# Patient Record
Sex: Male | Born: 1965 | Race: Black or African American | Hispanic: No | Marital: Married | Smoking: Never smoker
Health system: Southern US, Community
[De-identification: ages and names within clinical notes are randomized; demographics above are authoritative.]

## PROBLEM LIST (undated history)

## (undated) DIAGNOSIS — E079 Disorder of thyroid, unspecified: Secondary | ICD-10-CM

## (undated) DIAGNOSIS — I471 Supraventricular tachycardia, unspecified: Secondary | ICD-10-CM

## (undated) DIAGNOSIS — M109 Gout, unspecified: Secondary | ICD-10-CM

## (undated) DIAGNOSIS — E7800 Pure hypercholesterolemia, unspecified: Secondary | ICD-10-CM

## (undated) DIAGNOSIS — E119 Type 2 diabetes mellitus without complications: Secondary | ICD-10-CM

## (undated) DIAGNOSIS — I10 Essential (primary) hypertension: Secondary | ICD-10-CM

## (undated) HISTORY — DX: Morbid (severe) obesity due to excess calories: E66.01

## (undated) HISTORY — DX: Supraventricular tachycardia: I47.1

## (undated) HISTORY — DX: Supraventricular tachycardia, unspecified: I47.10

## (undated) HISTORY — PX: ACHILLES TENDON REPAIR: SUR1153

---

## 2016-10-04 ENCOUNTER — Emergency Department (HOSPITAL_COMMUNITY): Payer: Managed Care, Other (non HMO)

## 2016-10-04 ENCOUNTER — Observation Stay (HOSPITAL_COMMUNITY)
Admission: EM | Admit: 2016-10-04 | Discharge: 2016-10-05 | Disposition: A | Discharge status: Home / Self Care | Payer: Managed Care, Other (non HMO) | Attending: Interventional Cardiology | Admitting: Interventional Cardiology

## 2016-10-04 ENCOUNTER — Encounter (HOSPITAL_COMMUNITY): Payer: Self-pay | Admitting: Emergency Medicine

## 2016-10-04 DIAGNOSIS — R7989 Other specified abnormal findings of blood chemistry: Secondary | ICD-10-CM | POA: Diagnosis not present

## 2016-10-04 DIAGNOSIS — N183 Chronic kidney disease, stage 3 unspecified: Secondary | ICD-10-CM

## 2016-10-04 DIAGNOSIS — I471 Supraventricular tachycardia: Secondary | ICD-10-CM | POA: Diagnosis not present

## 2016-10-04 DIAGNOSIS — Z7982 Long term (current) use of aspirin: Secondary | ICD-10-CM | POA: Insufficient documentation

## 2016-10-04 DIAGNOSIS — R0683 Snoring: Secondary | ICD-10-CM

## 2016-10-04 DIAGNOSIS — R748 Abnormal levels of other serum enzymes: Secondary | ICD-10-CM

## 2016-10-04 DIAGNOSIS — I2510 Atherosclerotic heart disease of native coronary artery without angina pectoris: Secondary | ICD-10-CM | POA: Insufficient documentation

## 2016-10-04 DIAGNOSIS — I129 Hypertensive chronic kidney disease with stage 1 through stage 4 chronic kidney disease, or unspecified chronic kidney disease: Secondary | ICD-10-CM | POA: Diagnosis not present

## 2016-10-04 DIAGNOSIS — E119 Type 2 diabetes mellitus without complications: Secondary | ICD-10-CM | POA: Diagnosis not present

## 2016-10-04 DIAGNOSIS — Z79899 Other long term (current) drug therapy: Secondary | ICD-10-CM | POA: Diagnosis not present

## 2016-10-04 DIAGNOSIS — Z7984 Long term (current) use of oral hypoglycemic drugs: Secondary | ICD-10-CM | POA: Diagnosis not present

## 2016-10-04 DIAGNOSIS — M109 Gout, unspecified: Secondary | ICD-10-CM | POA: Insufficient documentation

## 2016-10-04 DIAGNOSIS — E1121 Type 2 diabetes mellitus with diabetic nephropathy: Secondary | ICD-10-CM

## 2016-10-04 DIAGNOSIS — R000 Tachycardia, unspecified: Secondary | ICD-10-CM | POA: Diagnosis present

## 2016-10-04 DIAGNOSIS — E785 Hyperlipidemia, unspecified: Secondary | ICD-10-CM | POA: Insufficient documentation

## 2016-10-04 DIAGNOSIS — R778 Other specified abnormalities of plasma proteins: Secondary | ICD-10-CM

## 2016-10-04 DIAGNOSIS — I10 Essential (primary) hypertension: Secondary | ICD-10-CM | POA: Diagnosis not present

## 2016-10-04 HISTORY — DX: Gout, unspecified: M10.9

## 2016-10-04 HISTORY — DX: Pure hypercholesterolemia, unspecified: E78.00

## 2016-10-04 HISTORY — DX: Disorder of thyroid, unspecified: E07.9

## 2016-10-04 HISTORY — DX: Type 2 diabetes mellitus without complications: E11.9

## 2016-10-04 HISTORY — DX: Essential (primary) hypertension: I10

## 2016-10-04 LAB — CBC WITH DIFFERENTIAL/PLATELET
BASOS ABS: 0 10*3/uL (ref 0.0–0.1)
BASOS PCT: 0 %
Eosinophils Absolute: 0.1 10*3/uL (ref 0.0–0.7)
Eosinophils Relative: 1 %
HEMATOCRIT: 47.7 % (ref 39.0–52.0)
Hemoglobin: 15.2 g/dL (ref 13.0–17.0)
Lymphocytes Relative: 29 %
Lymphs Abs: 2.9 10*3/uL (ref 0.7–4.0)
MCH: 25.2 pg — ABNORMAL LOW (ref 26.0–34.0)
MCHC: 31.9 g/dL (ref 30.0–36.0)
MCV: 79.1 fL (ref 78.0–100.0)
MONO ABS: 0.8 10*3/uL (ref 0.1–1.0)
Monocytes Relative: 8 %
NEUTROS ABS: 6 10*3/uL (ref 1.7–7.7)
Neutrophils Relative %: 62 %
PLATELETS: 224 10*3/uL (ref 150–400)
RBC: 6.03 MIL/uL — ABNORMAL HIGH (ref 4.22–5.81)
RDW: 16 % — AB (ref 11.5–15.5)
WBC: 9.8 10*3/uL (ref 4.0–10.5)

## 2016-10-04 LAB — COMPREHENSIVE METABOLIC PANEL
ALBUMIN: 4.1 g/dL (ref 3.5–5.0)
ALT: 72 U/L — AB (ref 17–63)
AST: 39 U/L (ref 15–41)
Alkaline Phosphatase: 83 U/L (ref 38–126)
Anion gap: 11 (ref 5–15)
BILIRUBIN TOTAL: 1.3 mg/dL — AB (ref 0.3–1.2)
BUN: 15 mg/dL (ref 6–20)
CO2: 27 mmol/L (ref 22–32)
Calcium: 10.1 mg/dL (ref 8.9–10.3)
Chloride: 104 mmol/L (ref 101–111)
Creatinine, Ser: 1.59 mg/dL — ABNORMAL HIGH (ref 0.61–1.24)
GFR calc Af Amer: 56 mL/min — ABNORMAL LOW (ref >60)
GFR calc non Af Amer: 49 mL/min — ABNORMAL LOW (ref >60)
Glucose, Bld: 146 mg/dL — ABNORMAL HIGH (ref 65–99)
POTASSIUM: 4 mmol/L (ref 3.5–5.1)
Sodium: 142 mmol/L (ref 135–145)
TOTAL PROTEIN: 7.6 g/dL (ref 6.5–8.1)

## 2016-10-04 LAB — I-STAT TROPONIN, ED
TROPONIN I, POC: 0.01 ng/mL (ref 0.00–0.08)
TROPONIN I, POC: 0.14 ng/mL — AB (ref 0.00–0.08)
TROPONIN I, POC: 0.32 ng/mL — AB (ref 0.00–0.08)

## 2016-10-04 LAB — BRAIN NATRIURETIC PEPTIDE: B NATRIURETIC PEPTIDE 5: 44.9 pg/mL (ref 0.0–100.0)

## 2016-10-04 LAB — GLUCOSE, CAPILLARY
GLUCOSE-CAPILLARY: 213 mg/dL — AB (ref 65–99)
GLUCOSE-CAPILLARY: 153 mg/dL — AB (ref 65–99)

## 2016-10-04 LAB — TROPONIN I: Troponin I: 0.69 ng/mL (ref <0.03)

## 2016-10-04 LAB — BASIC METABOLIC PANEL
ANION GAP: 11 (ref 5–15)
BUN: 14 mg/dL (ref 6–20)
CO2: 25 mmol/L (ref 22–32)
Calcium: 9.7 mg/dL (ref 8.9–10.3)
Chloride: 102 mmol/L (ref 101–111)
Creatinine, Ser: 2.03 mg/dL — ABNORMAL HIGH (ref 0.61–1.24)
GFR, EST AFRICAN AMERICAN: 42 mL/min — AB (ref >60)
GFR, EST NON AFRICAN AMERICAN: 36 mL/min — AB (ref >60)
Glucose, Bld: 214 mg/dL — ABNORMAL HIGH (ref 65–99)
Potassium: 3.4 mmol/L — ABNORMAL LOW (ref 3.5–5.1)
SODIUM: 138 mmol/L (ref 135–145)

## 2016-10-04 LAB — T4, FREE: FREE T4: 0.85 ng/dL (ref 0.61–1.12)

## 2016-10-04 LAB — TSH
TSH: 8.242 u[IU]/mL — AB (ref 0.350–4.500)
TSH: 2.097 u[IU]/mL (ref 0.350–4.500)

## 2016-10-04 LAB — CBC
HCT: 47.2 % (ref 39.0–52.0)
Hemoglobin: 15.1 g/dL (ref 13.0–17.0)
MCH: 25.3 pg — ABNORMAL LOW (ref 26.0–34.0)
MCHC: 32 g/dL (ref 30.0–36.0)
MCV: 79.2 fL (ref 78.0–100.0)
PLATELETS: 243 10*3/uL (ref 150–400)
RBC: 5.96 MIL/uL — AB (ref 4.22–5.81)
RDW: 15.9 % — ABNORMAL HIGH (ref 11.5–15.5)
WBC: 10.5 10*3/uL (ref 4.0–10.5)

## 2016-10-04 LAB — HEMOGLOBIN A1C
HEMOGLOBIN A1C: 7.8 % — AB (ref 4.8–5.6)
Mean Plasma Glucose: 177.16 mg/dL

## 2016-10-04 LAB — MAGNESIUM: Magnesium: 2.2 mg/dL (ref 1.7–2.4)

## 2016-10-04 MED ORDER — INSULIN ASPART 100 UNIT/ML ~~LOC~~ SOLN
0.0000 [IU] | Freq: Three times a day (TID) | SUBCUTANEOUS | Status: DC
  Administered 2016-10-04: 3 [IU] via SUBCUTANEOUS
  Administered 2016-10-05 (×2): 1 [IU] via SUBCUTANEOUS

## 2016-10-04 MED ORDER — INSULIN ASPART 100 UNIT/ML ~~LOC~~ SOLN: 0.0000 [IU] | Freq: Three times a day (TID) | SUBCUTANEOUS | Status: DC

## 2016-10-04 MED ORDER — SODIUM CHLORIDE 0.9% FLUSH
3.0000 mL | Freq: Two times a day (BID) | INTRAVENOUS | Status: DC
  Administered 2016-10-04 – 2016-10-05 (×2): 3 mL via INTRAVENOUS

## 2016-10-04 MED ORDER — VITAMIN D (ERGOCALCIFEROL) 1.25 MG (50000 UNIT) PO CAPS: 50000.0000 [IU] | ORAL_CAPSULE | ORAL | Status: DC

## 2016-10-04 MED ORDER — LOSARTAN POTASSIUM 50 MG PO TABS
50.0000 mg | ORAL_TABLET | Freq: Every day | ORAL | Status: DC
  Administered 2016-10-04 – 2016-10-05 (×2): 50 mg via ORAL
  Filled 2016-10-04 (×2): qty 1

## 2016-10-04 MED ORDER — POTASSIUM CHLORIDE CRYS ER 20 MEQ PO TBCR
40.0000 meq | EXTENDED_RELEASE_TABLET | Freq: Once | ORAL | Status: AC
  Administered 2016-10-04: 40 meq via ORAL
  Filled 2016-10-04: qty 2

## 2016-10-04 MED ORDER — MELOXICAM 7.5 MG PO TABS
15.0000 mg | ORAL_TABLET | Freq: Every day | ORAL | Status: DC
  Administered 2016-10-04 – 2016-10-05 (×2): 15 mg via ORAL
  Filled 2016-10-04: qty 1
  Filled 2016-10-04: qty 2

## 2016-10-04 MED ORDER — ASPIRIN 81 MG PO CHEW
324.0000 mg | CHEWABLE_TABLET | Freq: Once | ORAL | Status: AC
  Administered 2016-10-04: 324 mg via ORAL
  Filled 2016-10-04: qty 4

## 2016-10-04 MED ORDER — ONDANSETRON HCL 4 MG/2ML IJ SOLN: 4.0000 mg | Freq: Four times a day (QID) | INTRAMUSCULAR | Status: DC | PRN

## 2016-10-04 MED ORDER — ADENOSINE 6 MG/2ML IV SOLN
6.0000 mg | Freq: Once | INTRAVENOUS | Status: AC
  Administered 2016-10-04: 6 mg via INTRAVENOUS

## 2016-10-04 MED ORDER — METOPROLOL TARTRATE 50 MG PO TABS
50.0000 mg | ORAL_TABLET | Freq: Two times a day (BID) | ORAL | Status: DC
  Administered 2016-10-04 – 2016-10-05 (×2): 50 mg via ORAL
  Filled 2016-10-04 (×2): qty 1

## 2016-10-04 MED ORDER — ASPIRIN EC 81 MG PO TBEC: 81.0000 mg | DELAYED_RELEASE_TABLET | Freq: Every day | ORAL | Status: DC

## 2016-10-04 MED ORDER — ADENOSINE 6 MG/2ML IV SOLN
INTRAVENOUS | Status: AC
  Filled 2016-10-04: qty 6

## 2016-10-04 MED ORDER — SODIUM CHLORIDE 0.9 % IV SOLN: 250.0000 mL | INTRAVENOUS | Status: DC | PRN

## 2016-10-04 MED ORDER — NITROGLYCERIN 0.4 MG SL SUBL
0.4000 mg | SUBLINGUAL_TABLET | SUBLINGUAL | Status: DC | PRN
Start: 2016-10-04 — End: 2016-10-05

## 2016-10-04 MED ORDER — LEVOTHYROXINE SODIUM 100 MCG PO TABS
100.0000 ug | ORAL_TABLET | Freq: Every day | ORAL | Status: DC
  Administered 2016-10-05: 100 ug via ORAL
  Filled 2016-10-04: qty 1

## 2016-10-04 MED ORDER — ENOXAPARIN SODIUM 150 MG/ML ~~LOC~~ SOLN
160.0000 mg | Freq: Two times a day (BID) | SUBCUTANEOUS | Status: DC
  Administered 2016-10-04 – 2016-10-05 (×2): 160 mg via SUBCUTANEOUS
  Filled 2016-10-04 (×2): qty 2

## 2016-10-04 MED ORDER — LOSARTAN POTASSIUM-HCTZ 50-12.5 MG PO TABS: 1.0000 | ORAL_TABLET | Freq: Every day | ORAL | Status: DC

## 2016-10-04 MED ORDER — PHENYLEPHRINE 40 MCG/ML (10ML) SYRINGE FOR IV PUSH (FOR BLOOD PRESSURE SUPPORT)
PREFILLED_SYRINGE | INTRAVENOUS | Status: AC
  Filled 2016-10-04: qty 10

## 2016-10-04 MED ORDER — HYDROCHLOROTHIAZIDE 12.5 MG PO CAPS
12.5000 mg | ORAL_CAPSULE | Freq: Every day | ORAL | Status: DC
  Administered 2016-10-04 – 2016-10-05 (×2): 12.5 mg via ORAL
  Filled 2016-10-04 (×2): qty 1

## 2016-10-04 MED ORDER — ACETAMINOPHEN 325 MG PO TABS
650.0000 mg | ORAL_TABLET | ORAL | Status: DC | PRN
Start: 2016-10-04 — End: 2016-10-05

## 2016-10-04 MED ORDER — CANAGLIFLOZIN 100 MG PO TABS
100.0000 mg | ORAL_TABLET | Freq: Every day | ORAL | Status: DC
  Administered 2016-10-05: 100 mg via ORAL
  Filled 2016-10-04: qty 1

## 2016-10-04 MED ORDER — ASPIRIN 81 MG PO CHEW: 324.0000 mg | CHEWABLE_TABLET | ORAL | Status: DC

## 2016-10-04 MED ORDER — ASPIRIN EC 81 MG PO TBEC
81.0000 mg | DELAYED_RELEASE_TABLET | Freq: Every day | ORAL | Status: DC
  Administered 2016-10-04 – 2016-10-05 (×2): 81 mg via ORAL
  Filled 2016-10-04 (×2): qty 1

## 2016-10-04 MED ORDER — SODIUM CHLORIDE 0.9% FLUSH: 3.0000 mL | INTRAVENOUS | Status: DC | PRN

## 2016-10-04 MED ORDER — METFORMIN HCL 500 MG PO TABS
1000.0000 mg | ORAL_TABLET | Freq: Two times a day (BID) | ORAL | Status: DC
  Administered 2016-10-04: 1000 mg via ORAL
  Filled 2016-10-04 (×2): qty 2

## 2016-10-04 MED ORDER — ASPIRIN 300 MG RE SUPP: 300.0000 mg | RECTAL | Status: DC

## 2016-10-04 NOTE — ED Notes (Signed)
Pt is awaiting Cardiology consult. Per Bard Herbertyler PA schlossman MD spoke with him at 0700.

## 2016-10-04 NOTE — H&P (Signed)
Cardiology Admission History and Physical:   Patient ID: Walter Gonzales; MRN: [id number]; DOB: 1965-03-08   Admission date: 10/04/2016  Primary Care Provider: Shelbie Ammons, MD Primary Cardiologist: Thayer Ohm Primary Electrophysiologist:  None  Chief Complaint:  SVT and elevated cardiac markers.  Patient Profile:   Walter Gonzales is a 51 y.o. male with a history of Morbid obesity, gout, hypertension, diabetes mellitus, hyperlipidemia, and premature coronary atherosclerosis who presented with SVT and heart rates greater than 210 bpm. It was accompanied by chest pressure. Adenosine broke the arrhythmia and the patient immediately began feeling better.  History of Present Illness:   Walter Gonzales awakened from sleep and noticed diaphoresis, weakness, chest tightness, and cough. This feeling persisted. Over the past 2 years he had had wanted to previous similar episodes over the last several minutes and resolved with coughing or belching. He continued to feel "sick" and told his wife he needed to come for evaluation. In the emergency room he was found to have narrow complex tachycardia at 216 bpm that broke with IV adenosine. Subsequently cardiac markers are trending upward. He is asymptomatic. Chest discomfort resolved after conversion.  He is ordinarily quite active. He bowls frequently. He is able to walk stairs in his house without difficulty. He is a [employer]. No history of heart disease. Both his mother and father died of myocardial infarction. Grandmother died of congestive heart failure.   Past Medical History:  Diagnosis Date  . Diabetes mellitus without complication (HCC)   . Gout   . Hypercholesterolemia   . Hypertension   . Thyroid disease    Hypothyroidism    Past Surgical History:  Procedure Laterality Date  . ACHILLES TENDON REPAIR Right      Medications Prior to Admission: Prior to Admission medications   Medication Sig Start Date End Date Taking?  Authorizing Provider  aspirin EC 81 MG tablet Take 81 mg by mouth daily.   Yes [provider]  empagliflozin (JARDIANCE) 10 MG TABS tablet Take 10 mg by mouth daily.   Yes [provider]  levothyroxine (SYNTHROID, LEVOTHROID) 100 MCG tablet Take 100 mcg by mouth daily before breakfast.   Yes [provider]  losartan-hydrochlorothiazide (HYZAAR) 50-12.5 MG tablet Take 1 tablet by mouth daily.   Yes [provider]  meloxicam (MOBIC) 15 MG tablet Take 15 mg by mouth daily.   Yes [provider]  metFORMIN (GLUCOPHAGE) 500 MG tablet Take 1,000 mg by mouth 2 (two) times daily with a meal.   Yes [provider]  Vitamin D, Ergocalciferol, (DRISDOL) 50000 units CAPS capsule Take 50,000 Units by mouth 2 (two) times a week. Monday and Tuesday   Yes [provider]     Allergies:   No Known Allergies  Social History:   Social History   Social History  . Marital status: Married    Spouse name: N/A  . Number of children: N/A  . Years of education: N/A   Occupational History  . Not on file.   Social History Main Topics  . Smoking status: Never Smoker  . Smokeless tobacco: Never Used  . Alcohol use Yes     Comment: occ  . Drug use: No  . Sexual activity: Yes   Other Topics Concern  . Not on file   Social History Narrative  . No narrative on file    Family History:   The patient's family history includes Heart attack in his father and mother;  Heart failure in his maternal grandmother.    ROS:  Please see the history of present illness.  Currently having a gout attack. He snores at night. Doesn't sleep well. Prior history of ruptured Achilles tendon in the right lower extremity. Blood pressures are not well controlled. All other ROS reviewed and negative.     Physical Exam/Data:   Vitals:   10/04/16 0730 10/04/16 0830 10/04/16 0903 10/04/16 0930  BP: (!) 176/106 (!) 166/100 (!) 166/110 (!) 162/103  Pulse: 96 97 86 99   Resp: (!) 21 20 18 19   Temp:      TempSrc:      SpO2: 98% 98% 95% 97%  Weight:      Height:       No intake or output data in the 24 hours ending 10/04/16 1031 Filed Weights   10/04/16 0051  Weight: (!) 360 lb (163.3 kg)   Body mass index is 45 kg/m.  General:  Well nourished, well developed, in no acute distress. Morbidly obese. HEENT: normal Lymph: no adenopathy Neck: no JVD Endocrine:  No thryomegaly Vascular: No carotid bruits; FA pulses 2+ bilaterally without bruits  Cardiac:  normal S1, S2; RRR; no murmur . Lungs:  clear to auscultation bilaterally, no wheezing, rhonchi or rales  Abd: soft, nontender, no hepatomegaly  Ext: no edema Musculoskeletal:  No deformities, BUE and BLE strength normal and equal Skin: warm and dry  Neuro:  CNs 2-12 intact, no focal abnormalities noted Psych:  Normal affect    EKG:  The ECG that was done on 10/04/2016 at 12:46 AM was personally reviewed and demonstrates  revealed narrow complex tachycardia at 216 bpm with diffuse J-point depression. Postconversion EKG on 10/04/2016 at 1:20 AM revealed leftward axis, normal sinus rhythm, with minimal J-point depression. No acute ST segment changes were noted.  Relevant CV Studies: None  Laboratory Data:  Chemistry Recent Labs Lab 10/04/16 0050  NA 138  K 3.4*  CL 102  CO2 25  GLUCOSE 214*  BUN 14  CREATININE 2.03*  CALCIUM 9.7  GFRNONAA 36*  GFRAA 42*  ANIONGAP 11    No results for input(s): PROT, ALBUMIN, AST, ALT, ALKPHOS, BILITOT in the last 168 hours. Hematology Recent Labs Lab 10/04/16 0050  WBC 10.5  RBC 5.96*  HGB 15.1  HCT 47.2  MCV 79.2  MCH 25.3*  MCHC 32.0  RDW 15.9*  PLT 243   Cardiac EnzymesNo results for input(s): TROPONINI in the last 168 hours.  Recent Labs Lab 10/04/16 0140 10/04/16 0412 10/04/16 0730  TROPIPOC 0.01 0.14* 0.32*    BNPNo results for input(s): BNP, PROBNP in the last 168 hours.  DDimer No results for input(s): DDIMER in the last 168  hours.  Radiology/Studies:  Dg Chest Portable 1 View  Result Date: 10/04/2016 CLINICAL DATA:  Non radiating chest pain for 45 minutes. Cough. History of hypertension and diabetes. Shortness of breath. EXAM: PORTABLE CHEST 1 VIEW COMPARISON:  None. FINDINGS: Shallow inspiration. Normal heart size and pulmonary vascularity. No focal airspace disease or consolidation in the lungs. No blunting of costophrenic angles. No pneumothorax. Mediastinal contours appear intact. Degenerative changes in the spine. IMPRESSION: No active disease. Electronically Signed   By: Burman Nieves M.D.   On: 10/04/2016 02:22    Assessment and Plan:   1. Paroxysmal supraventricular tachycardia, extremely fast raising concern for accessory pathway. Converted with IV adenosine. Possible brief prior episodes over the past year lasting less than 5 minutes, and not more than 2 in  number. Start beta blocker therapy. Consider EP consultation. 2. Chest discomfort during tachycardia with associated mild increase in troponin. Likely demand supply mismatch. No clinical evidence for acute coronary syndrome. 2-D echocardiogram and cardiac marker trending will be done. 3. Morbid obesity. 4. History of snoring. Given obesity, sleep apnea needs to be considered. 5. Diabetes mellitus, type II 6. Chronic kidney disease, stage III. Creatinine greater than 2 and GFR estimated at 40 to milliliters per minute  Severity of Illness: The appropriate patient status for this patient is INPATIENT. Inpatient status is judged to be reasonable and necessary in order to provide the required intensity of service to ensure the patient's safety. The patient's presenting symptoms, physical exam findings, and initial radiographic and laboratory data in the context of their chronic comorbidities is felt to place them at high risk for further clinical deterioration. Furthermore, it is not anticipated that the patient will be medically stable for discharge from  the hospital within 2 midnights of admission. The following factors support the patient status of inpatient.   " The patient's presenting symptoms include chest pain and cough. " The worrisome physical exam findings include severe tachycardia . " The initial radiographic and laboratory data are worrisome because of elevated troponin I. " The chronic co-morbidities include DM II, Htn, Morbid obesity.   * I certify that at the point of admission it is my clinical judgment that the patient will require inpatient hospital care spanning beyond 2 midnights from the point of admission due to high intensity of service, high risk for further deterioration and high frequency of surveillance required.*    Signed, Lesleigh NoeHenry W Baylyn Sickles III, MD  10/04/2016 10:31 AM

## 2016-10-04 NOTE — Progress Notes (Signed)
CRITICAL VALUE ALERT  Critical Value:  Troponin 0.69  Date & Time Notied:  10/04/16 @ 8:51pm  Provider Notified: Allena Katzhakravartti, MD  Orders Received/Actions taken: No new orders at this time.

## 2016-10-04 NOTE — ED Triage Notes (Signed)
Pt to ED c/o central non-radiating CP x 45 minutes, states he began coughing and then it came on. Pt hx HTN, high cholesterol, DM. Pt endorses SOB, denies dizziness/N/V. Pt is very diaphoretic upon arrival. Resp equal and slightly labored.

## 2016-10-04 NOTE — ED Provider Notes (Signed)
[facility]-EMERGENCY DEPT Provider Note   CSN: [id number] Arrival date & time: 10/04/16  0043     History   Chief Complaint Chief Complaint  Patient presents with  . Chest Pain    HPI Walter Gonzales is a 51 y.o. male.  HPI   51 year old male with a history of diabetes, hypertension, hypercholesterolemia presents with concern for chest pain, diaphoresis and palpitations for one hour. Patient reports symptoms began 1 hour prior to arrival. Reports in aching in the center of his chest without radiation, sensation of fast heart rate, and some associated nausea. Reports that it initially started after he had coughed, and then he began to have the sensation. Denies significant associated dyspnea. Denies any prior cardiac history. Reports he is in normal state of health today, with 8 gout pain in his left ankle for which she stayed home from work. Reports he took his gout medicine today but otherwise has not taken any new medications. Denies history of smoking, alcohol or drug use.  Past Medical History:  Diagnosis Date  . Diabetes mellitus without complication (HCC)   . Gout   . Hypercholesterolemia   . Hypertension   . Thyroid disease    Hypothyroidism    Patient Active Problem List   Diagnosis Date Noted  . SVT (supraventricular tachycardia) (HCC) 10/04/2016  . Elevated troponin 10/04/2016  . Morbid obesity (HCC) 10/04/2016  . Type 2 diabetes mellitus (HCC) 10/04/2016  . Snoring 10/04/2016  . Essential hypertension 10/04/2016  . Tachycardia 10/04/2016    Past Surgical History:  Procedure Laterality Date  . ACHILLES TENDON REPAIR Right        Home Medications    Prior to Admission medications   Medication Sig Start Date End Date Taking? Authorizing Provider  aspirin EC 81 MG tablet Take 81 mg by mouth daily.   Yes [provider]  empagliflozin (JARDIANCE) 10 MG TABS tablet Take 10 mg by mouth daily.   Yes [provider]  levothyroxine (SYNTHROID,  LEVOTHROID) 100 MCG tablet Take 100 mcg by mouth daily before breakfast.   Yes [provider]  losartan-hydrochlorothiazide (HYZAAR) 50-12.5 MG tablet Take 1 tablet by mouth daily.   Yes [provider]  meloxicam (MOBIC) 15 MG tablet Take 15 mg by mouth daily.   Yes [provider]  metFORMIN (GLUCOPHAGE) 500 MG tablet Take 1,000 mg by mouth 2 (two) times daily with a meal.   Yes [provider]  Vitamin D, Ergocalciferol, (DRISDOL) 50000 units CAPS capsule Take 50,000 Units by mouth 2 (two) times a week. Monday and Tuesday   Yes [provider]    Family History Family History  Problem Relation Age of Onset  . Heart attack Mother   . Heart attack Father   . Heart failure Maternal Grandmother     Social History Social History  Substance Use Topics  . Smoking status: Never Smoker  . Smokeless tobacco: Never Used  . Alcohol use Yes     Comment: occ     Allergies   Patient has no known allergies.   Review of Systems Review of Systems  Constitutional: Positive for diaphoresis. Negative for fever.  HENT: Negative for sore throat.   Eyes: Negative for visual disturbance.  Respiratory: Negative for shortness of breath.   Cardiovascular: Positive for chest pain.  Gastrointestinal: Negative for abdominal pain, nausea and vomiting.  Genitourinary: Negative for difficulty urinating.  Musculoskeletal: Negative for back pain and neck stiffness.  Skin: Negative for rash.  Neurological:  Negative for syncope, weakness, numbness and headaches.     Physical Exam Updated Vital Signs BP (!) 162/103   Pulse 99   Temp 98 F (36.7 C) (Oral)   Resp 19   Ht 6\' 3"  (1.905 m)   Wt (!) 163.3 kg (360 lb)   SpO2 97%   BMI 45.00 kg/m   Physical Exam  Constitutional: He is oriented to person, place, and time. He appears well-developed and well-nourished. No distress.  HENT:  Head: Normocephalic and atraumatic.  Eyes: Conjunctivae and EOM are  normal.  Neck: Normal range of motion.  Cardiovascular: Regular rhythm, normal heart sounds and intact distal pulses.  Tachycardia present.  Exam reveals no gallop and no friction rub.   No murmur heard. Pulmonary/Chest: Effort normal and breath sounds normal. No respiratory distress. He has no wheezes. He has no rales.  Abdominal: Soft. He exhibits no distension. There is no tenderness. There is no guarding.  Musculoskeletal: He exhibits no edema.  Neurological: He is alert and oriented to person, place, and time.  Skin: Skin is warm. He is diaphoretic.  Nursing note and vitals reviewed.    ED Treatments / Results  Labs (all labs ordered are listed, but only abnormal results are displayed) Labs Reviewed  BASIC METABOLIC PANEL - Abnormal; Notable for the following:       Result Value   Potassium 3.4 (*)    Glucose, Bld 214 (*)    Creatinine, Ser 2.03 (*)    GFR calc non Af Amer 36 (*)    GFR calc Af Amer 42 (*)    All other components within normal limits  CBC - Abnormal; Notable for the following:    RBC 5.96 (*)    MCH 25.3 (*)    RDW 15.9 (*)    All other components within normal limits  TSH - Abnormal; Notable for the following:    TSH 8.242 (*)    All other components within normal limits  I-STAT TROPONIN, ED - Abnormal; Notable for the following:    Troponin i, poc 0.14 (*)    All other components within normal limits  I-STAT TROPONIN, ED - Abnormal; Notable for the following:    Troponin i, poc 0.32 (*)    All other components within normal limits  T4, FREE  I-STAT TROPONIN, ED    EKG  EKG Interpretation  Date/Time:  Wednesday October 04 2016 07:32:19 EDT Ventricular Rate:  91 PR Interval:    QRS Duration: 109 QT Interval:  377 QTC Calculation: 464 R Axis:   -58 Text Interpretation:  Sinus tachycardia Ventricular premature complex Probable left atrial enlargement Incomplete RBBB and LAFB RSR' in V1 or V2, right VCD or RVH Baseline wander in lead(s) II III  aVF Partial missing lead(s): V6 No significant change since last tracing Confirmed by Alvira MondaySchlossman, Krishan Mcbreen ([id number]) on 10/04/2016 7:51:32 AM       Radiology Dg Chest Portable 1 View  Result Date: 10/04/2016 CLINICAL DATA:  Non radiating chest pain for 45 minutes. Cough. History of hypertension and diabetes. Shortness of breath. EXAM: PORTABLE CHEST 1 VIEW COMPARISON:  None. FINDINGS: Shallow inspiration. Normal heart size and pulmonary vascularity. No focal airspace disease or consolidation in the lungs. No blunting of costophrenic angles. No pneumothorax. Mediastinal contours appear intact. Degenerative changes in the spine. IMPRESSION: No active disease. Electronically Signed   By: Burman NievesWilliam  Stevens M.D.   On: 10/04/2016 02:22    Procedures Procedures (including critical care time)  Medications Ordered in  ED Medications  adenosine (ADENOCARD) 6 MG/2ML injection 6 mg (6 mg Intravenous Given 10/04/16 0117)  potassium chloride SA (K-DUR,KLOR-CON) CR tablet 40 mEq (40 mEq Oral Given 10/04/16 0325)  aspirin chewable tablet 324 mg (324 mg Oral Given 10/04/16 0546)   CRITICAL CARE Performed by: Rhae Lerner   Total critical care time: 30 minutes  Critical care time was exclusive of separately billable procedures and treating other patients.  Critical care was necessary to treat or prevent imminent or life-threatening deterioration.  Critical care was time spent personally by me on the following activities: development of treatment plan with patient and/or surrogate as well as nursing, discussions with consultants, evaluation of patient's response to treatment, examination of patient, obtaining history from patient or surrogate, ordering and performing treatments and interventions, ordering and review of laboratory studies, ordering and review of radiographic studies, pulse oximetry and re-evaluation of patient's condition.   Initial Impression / Assessment and Plan / ED Course  I have  reviewed the triage vital signs and the nursing notes.  Pertinent labs & imaging results that were available during my care of the patient were reviewed by me and considered in my medical decision making (see chart for details).    51 year old male with a history of diabetes, hypertension, hypercholesterolemia presents with concern for chest pain, diaphoresis and palpitations for one hour. Patient in SVT with a rate of 200 on arrival.  Attempted vagal maneuvers without relief. He was given 6 mg of adenosine, with cardioversion to normal sinus rhythm.  Does report some continuing chest pain following conversion to sinus rhythm, although reports it is greatly improved.   Labs completed showing mild hypokalemia. Troponin checked given chest pain present after conversion to sinus rhythm, elevated to .14, likely secondary to demand ischemia from elevated heart rate. Given aspirin. Consulted Cardiology, Dr. Katrinka Blazing, who recommends checking troponin again and if it doubles would admit for continued evaluation.    Troponin elevated to .32. Consulted Cardiology, Cr. Katrinka Blazing for admission.     Final Clinical Impressions(s) / ED Diagnoses   Final diagnoses:  SVT (supraventricular tachycardia) (HCC)    New Prescriptions New Prescriptions   No medications on file     Alvira Monday, MD 10/04/16 1030

## 2016-10-04 NOTE — ED Notes (Signed)
Attempted report.  Was told the RN was unaware she was receiving a pt but would call after RN has been notified

## 2016-10-04 NOTE — ED Notes (Signed)
Dr. Schlossman at bedside. 

## 2016-10-04 NOTE — ED Notes (Signed)
Spoke with MD Katrinka BlazingSmith he is aware pts repeat trop 0.32 and is planning to come see patient in the ED soon. Pt and family updated. Pt is resting in bed at this time with no pain or discomfort, in NSR.

## 2016-10-05 ENCOUNTER — Other Ambulatory Visit: Payer: Self-pay | Admitting: Cardiology

## 2016-10-05 ENCOUNTER — Inpatient Hospital Stay (HOSPITAL_BASED_OUTPATIENT_CLINIC_OR_DEPARTMENT_OTHER): Payer: Managed Care, Other (non HMO)

## 2016-10-05 DIAGNOSIS — Z79899 Other long term (current) drug therapy: Secondary | ICD-10-CM

## 2016-10-05 DIAGNOSIS — I361 Nonrheumatic tricuspid (valve) insufficiency: Secondary | ICD-10-CM | POA: Diagnosis not present

## 2016-10-05 DIAGNOSIS — I10 Essential (primary) hypertension: Secondary | ICD-10-CM | POA: Diagnosis not present

## 2016-10-05 DIAGNOSIS — I471 Supraventricular tachycardia: Secondary | ICD-10-CM | POA: Diagnosis not present

## 2016-10-05 DIAGNOSIS — R748 Abnormal levels of other serum enzymes: Secondary | ICD-10-CM | POA: Diagnosis not present

## 2016-10-05 DIAGNOSIS — N183 Chronic kidney disease, stage 3 (moderate): Secondary | ICD-10-CM

## 2016-10-05 LAB — LIPID PANEL
CHOLESTEROL: 187 mg/dL (ref 0–200)
HDL: 55 mg/dL (ref >40)
LDL CALC: 82 mg/dL (ref 0–99)
TRIGLYCERIDES: 251 mg/dL — AB (ref <150)
Total CHOL/HDL Ratio: 3.4 RATIO
VLDL: 50 mg/dL — ABNORMAL HIGH (ref 0–40)

## 2016-10-05 LAB — BASIC METABOLIC PANEL
ANION GAP: 11 (ref 5–15)
BUN: 19 mg/dL (ref 6–20)
CO2: 25 mmol/L (ref 22–32)
Calcium: 9.5 mg/dL (ref 8.9–10.3)
Chloride: 104 mmol/L (ref 101–111)
Creatinine, Ser: 1.59 mg/dL — ABNORMAL HIGH (ref 0.61–1.24)
GFR calc Af Amer: 56 mL/min — ABNORMAL LOW (ref >60)
GFR, EST NON AFRICAN AMERICAN: 49 mL/min — AB (ref >60)
GLUCOSE: 141 mg/dL — AB (ref 65–99)
POTASSIUM: 4 mmol/L (ref 3.5–5.1)
Sodium: 140 mmol/L (ref 135–145)

## 2016-10-05 LAB — ECHOCARDIOGRAM COMPLETE
HEIGHTINCHES: 75 in
WEIGHTICAEL: 5702.4 [oz_av]

## 2016-10-05 LAB — HIV ANTIBODY (ROUTINE TESTING W REFLEX): HIV Screen 4th Generation wRfx: NONREACTIVE

## 2016-10-05 LAB — TROPONIN I
TROPONIN I: 0.5 ng/mL — AB (ref <0.03)
Troponin I: 0.62 ng/mL (ref <0.03)

## 2016-10-05 LAB — GLUCOSE, CAPILLARY
GLUCOSE-CAPILLARY: 139 mg/dL — AB (ref 65–99)
GLUCOSE-CAPILLARY: 150 mg/dL — AB (ref 65–99)

## 2016-10-05 MED ORDER — LOSARTAN POTASSIUM 100 MG PO TABS: 100.0000 mg | ORAL_TABLET | Freq: Every day | ORAL | 1 refills | Status: DC

## 2016-10-05 MED ORDER — HYDROCHLOROTHIAZIDE 12.5 MG PO CAPS: 12.5000 mg | ORAL_CAPSULE | Freq: Every day | ORAL | 1 refills | Status: DC

## 2016-10-05 MED ORDER — LOSARTAN POTASSIUM 50 MG PO TABS: 100.0000 mg | ORAL_TABLET | Freq: Every day | ORAL | Status: DC

## 2016-10-05 MED ORDER — METOPROLOL TARTRATE 50 MG PO TABS: 50.0000 mg | ORAL_TABLET | Freq: Two times a day (BID) | ORAL | 3 refills | Status: DC

## 2016-10-05 NOTE — Progress Notes (Signed)
The patient and his wife have been given discharge instructions along with a new medication list and what to take today. He has follow up appointments and prescriptions to pick up. He is discharging with his wife via car.   Sheppard Evensina Adreanna Fickel RN

## 2016-10-05 NOTE — Progress Notes (Signed)
  Echocardiogram 2D Echocardiogram has been performed.  Walter Gonzales T Jonel Weldon 10/05/2016, 2:33 PM

## 2016-10-05 NOTE — Discharge Summary (Signed)
Discharge Summary    Patient ID: Walter Gonzales,  MRN: [id number], DOB/AGE: 10-08-1965 51 y.o.  Admit date: 10/04/2016 Discharge date: 10/05/2016  Primary Care Provider: Shelbie Ammons Primary Cardiologist: Katrinka Blazing   Discharge Diagnoses    Active Problems:   SVT (supraventricular tachycardia) (HCC)   Elevated troponin   Morbid obesity (HCC)   Type 2 diabetes mellitus (HCC)   Snoring   Essential hypertension   Tachycardia   CKD (chronic kidney disease), stage III   Allergies No Known Allergies  Diagnostic Studies/Procedures    N/a _____________   History of Present Illness     Walter Gonzales is a 51 y.o. male with a history of Morbid obesity, gout, hypertension, diabetes mellitus, hyperlipidemia, and premature coronary atherosclerosis who presented with SVT and heart rates greater than 210 bpm. It was accompanied by chest pressure. Adenosine broke the arrhythmia and the patient immediately began feeling better.  Walter Gonzales awakened from sleep and noticed diaphoresis, weakness, chest tightness, and cough. This feeling persisted. Over the past 2 years he had had events to previous similar episodes over the last several minutes and resolved with coughing or belching. He continued to feel "sick" and told his wife he needed to come for evaluation. In the emergency room he was found to have narrow complex tachycardia at 216 bpm that broke with IV adenosine. Subsequently cardiac markers are trending upward. He is asymptomatic. Chest discomfort resolved after conversion.  He is ordinarily quite active. He bowls frequently. He is able to walk stairs in his house without difficulty. He is a Well[employer]. No history of heart disease. Both his mother and father died of myocardial infarction. Grandmother died of congestive heart failure. He was admitted for further work up.   Hospital Course     He was started on metoprolol  BID and observed on telemetry with no further  arrhythmias noted. No further chest pain. Troponin cycled and peaked at 0.69. Other blood work was stable. Tolerated metoprolol without any issues. Increased his losartan from  to  daily. Stopped home Hyzaar as there is no formulary with 100/12.5. Will plan for outpatient EP evaluation, along with sleep study. Message sent to the office to arrange for sleep study. Echo completed prior to discharge.   General: Well developed, well nourished, male appearing in no acute distress. Head: Normocephalic, atraumatic.  Neck: Supple without bruits, JVD. Lungs:  Resp regular and unlabored, CTA. Heart: RRR, S1, S2, no S3, S4, or murmur; no rub. Abdomen: Soft, non-tender, non-distended with normoactive bowel sounds. No hepatomegaly. No rebound/guarding. No obvious abdominal masses. Extremities: No clubbing, cyanosis, edema. Distal pedal pulses are 2+ bilaterally.  Neuro: Alert and oriented X 3. Moves all extremities spontaneously. Psych: Normal affect.  Walter Gonzales was seen by Dr. Katrinka Blazing and determined stable for discharge home. Follow up in the office has been arranged. Medications are listed below.   _____________  Discharge Vitals Blood pressure (!) 159/98, pulse 75, temperature 98.4 F (36.9 C), temperature source Oral, resp. rate 18, height  (1.905 m), weight (!) 356 lb 6.4 oz (161.7 kg), SpO2 97 %.  Filed Weights   10/04/16 0051 10/04/16 1710 10/05/16 0411  Weight: (!) 360 lb (163.3 kg) (!) 354 lb 3.2 oz (160.7 kg) (!) 356 lb 6.4 oz (161.7 kg)    Labs & Radiologic Studies    CBC  Recent Labs  10/04/16 0050 10/04/16 1850  WBC 10.5 9.8  NEUTROABS  --  6.0  HGB 15.1  15.2  HCT 47.2 47.7  MCV 79.2 79.1  PLT 243 224   Basic Metabolic Panel  Recent Labs  10/04/16 1850 10/05/16 0506  NA 142 140  K 4.0 4.0  CL 104 104  CO2 27 25  GLUCOSE 146* 141*  BUN 15 19  CREATININE 1.59* 1.59*  CALCIUM 10.1 9.5  MG 2.2  --    Liver Function Tests  Recent Labs   10/04/16 1850  AST 39  ALT 72*  ALKPHOS 83  BILITOT 1.3*  PROT 7.6  ALBUMIN 4.1   No results for input(s): LIPASE, AMYLASE in the last 72 hours. Cardiac Enzymes  Recent Labs  10/04/16 1850 10/05/16 0003 10/05/16 0506  TROPONINI 0.69* 0.62* 0.50*   BNP Invalid input(s): POCBNP D-Dimer No results for input(s): DDIMER in the last 72 hours. Hemoglobin A1C  Recent Labs  10/04/16 1850  HGBA1C 7.8*   Fasting Lipid Panel  Recent Labs  10/05/16 0003  CHOL 187  HDL 55  LDLCALC 82  TRIG 251*  CHOLHDL 3.4   Thyroid Function Tests  Recent Labs  10/04/16 1850  TSH 2.097   _____________  Dg Chest Portable 1 View  Result Date: 10/04/2016 CLINICAL DATA:  Non radiating chest pain for 45 minutes. Cough. History of hypertension and diabetes. Shortness of breath. EXAM: PORTABLE CHEST 1 VIEW COMPARISON:  None. FINDINGS: Shallow inspiration. Normal heart size and pulmonary vascularity. No focal airspace disease or consolidation in the lungs. No blunting of costophrenic angles. No pneumothorax. Mediastinal contours appear intact. Degenerative changes in the spine. IMPRESSION: No active disease. Electronically Signed   By: Burman NievesWilliam  Stevens M.D.   On: 10/04/2016 02:22   Disposition   Pt is being discharged home today in good condition.  Follow-up Plans & Appointments    Follow-up Information    Hillis RangeAllred, James, MD Follow up on 10/16/2016.   Specialty:  Cardiology Why:  at 11:15am for your appt.  Contact information: [address] [place] [address] [phone]        [facility] Follow up on 10/12/2016.   Why:  Please come in for follow up blood work. The office will call you about setting up a sleep study.  Contact information: [address] [place] [phone]         Discharge Instructions    Call MD for:  persistant dizziness or light-headedness    Complete  by:  As directed    Diet - low sodium heart healthy    Complete by:  As directed    Discharge instructions    Complete by:  As directed    Keep a log of you blood pressures and bring back to your follow up appt. Please call the office with any questions.   Increase activity slowly    Complete by:  As directed       Discharge Medications     Medication List    STOP taking these medications   losartan-hydrochlorothiazide 50-12.5 MG tablet Commonly known as:  HYZAAR     TAKE these medications   aspirin EC 81 MG tablet Take 81 mg by mouth daily.   hydrochlorothiazide 12.5 MG capsule Commonly known as:  MICROZIDE Take 1 capsule (12.5 mg total) by mouth daily.   JARDIANCE 10 MG Tabs tablet Generic drug:  empagliflozin Take 10 mg by mouth daily.   levothyroxine 100 MCG tablet Commonly known as:  SYNTHROID, LEVOTHROID Take 100 mcg by mouth  daily before breakfast.   losartan 100 MG tablet Commonly known as:  COZAAR Take 1 tablet (100 mg total) by mouth daily.   meloxicam 15 MG tablet Commonly known as:  MOBIC Take 15 mg by mouth daily.   metFORMIN 500 MG tablet Commonly known as:  GLUCOPHAGE Take 1,000 mg by mouth 2 (two) times daily with a meal.   metoprolol tartrate 50 MG tablet Commonly known as:  LOPRESSOR Take 1 tablet (50 mg total) by mouth 2 (two) times daily.   Vitamin D (Ergocalciferol) 50000 units Caps capsule Commonly known as:  DRISDOL Take 50,000 Units by mouth 2 (two) times a week. Monday and Tuesday         Outstanding Labs/Studies   BMET in one week.   Duration of Discharge Encounter   Greater than 30 minutes including physician time.  Signed, Laverda Page NP-C 10/05/2016, 2:12 PM   The patient has been seen in conjunction with Laverda Page, NP-C. All aspects of care have been considered and discussed. The patient has been personally interviewed, examined, and all clinical data has been reviewed.   Document episode of SVT at 220  bpm associated with supply demand mismatch and enzyme elevation. Enzyme elevation was flat and not felt to represent acute coronary syndrome.  Plan tighten blood pressure control by adding metoprolol 50 mg twice a day and increasing losartan to 100/12.5 mg daily.  Outpatient EP consult to determine if ablation should be considered given the rate of the tachycardia, outpatient 2-D Doppler echocardiogram, and sleep study.  Clinical follow-up with me in 6-8 weeks.  Call if recurrent symptoms/tachycardia.

## 2016-10-10 ENCOUNTER — Telehealth: Payer: Self-pay | Admitting: *Deleted

## 2016-10-10 DIAGNOSIS — G4733 Obstructive sleep apnea (adult) (pediatric): Secondary | ICD-10-CM

## 2016-10-10 NOTE — Telephone Encounter (Signed)
-----   Message from Arty BaumgartnerLindsay B Roberts, NP sent at 10/06/2016  7:41 AM EDT ----- Regarding: RE: Outpatient Sleep Study Referred by Dr. Katrinka BlazingSmith for evaluation of possible OSA with recent admission with SVT.  ----- Message ----- From: Reesa ChewJones, Rukia Mcgillivray G, CMA Sent: 10/05/2016   3:59 PM To: Arty BaumgartnerLindsay B Roberts, NP Subject: RE: Outpatient Sleep Study                     Yes, send the referral and why they are being referred please. Thanks ----- Message ----- From: Arty Baumgartneroberts, Lindsay B, NP Sent: 10/05/2016   1:49 PM To: Reesa Cheworothea G Solstice Lastinger, CMA Subject: Outpatient Sleep Study                         Hey,   Are you the correct one to message about arranging sleep studies for patients? I have one that was seen by Dr. Katrinka BlazingSmith as an inpatient and will need one arranged. Being discharged home today.   Thanks! Laverda PageLindsay Roberts NP

## 2016-10-10 NOTE — Telephone Encounter (Signed)
Informed patient of upcoming sleep study and patient understanding was verbalized. Patient understands his sleep study is scheduled for Tuesday November 07 2016. Patient understands [facility]. Patient understands he will receive a sleep packet in a week or so. Patient understands to call if he does not receive the sleep packet in a timely manner. Patient agrees with treatment and thanked me for call.

## 2016-10-16 ENCOUNTER — Telehealth: Payer: Self-pay | Admitting: Interventional Cardiology

## 2016-10-16 ENCOUNTER — Ambulatory Visit: Payer: Managed Care, Other (non HMO) | Admitting: Internal Medicine

## 2016-10-16 NOTE — Telephone Encounter (Signed)
New message       Pt is due to have a sleep study in oct.  He was told by his ins company to have [place] fax an order and clinicals to [phone].  He has [employer]

## 2016-10-27 NOTE — Telephone Encounter (Signed)
Called patient to clarify what he needed LMTCB.

## 2016-10-31 ENCOUNTER — Telehealth: Payer: Self-pay | Admitting: *Deleted

## 2016-10-31 NOTE — Telephone Encounter (Addendum)
LMTCB.  Me    Called patient to clarify what he needed LMTCB.

## 2016-11-01 ENCOUNTER — Ambulatory Visit: Payer: Managed Care, Other (non HMO) | Admitting: Internal Medicine

## 2016-11-01 NOTE — Telephone Encounter (Signed)
LMTCB

## 2016-11-07 ENCOUNTER — Encounter (HOSPITAL_BASED_OUTPATIENT_CLINIC_OR_DEPARTMENT_OTHER): Payer: Managed Care, Other (non HMO)

## 2016-11-08 ENCOUNTER — Telehealth: Payer: Self-pay | Admitting: Interventional Cardiology

## 2016-11-08 NOTE — Telephone Encounter (Signed)
Left pt voicemail to return my call need to speak withhim about release form he has signed.

## 2016-11-24 ENCOUNTER — Ambulatory Visit: Payer: Managed Care, Other (non HMO) | Admitting: Interventional Cardiology

## 2016-11-27 ENCOUNTER — Encounter: Payer: Self-pay | Admitting: Internal Medicine

## 2016-11-27 ENCOUNTER — Ambulatory Visit (INDEPENDENT_AMBULATORY_CARE_PROVIDER_SITE_OTHER): Payer: Managed Care, Other (non HMO) | Admitting: Internal Medicine

## 2016-11-27 VITALS — BP 152/90 | HR 56 | Ht 75.0 in | Wt 360.2 lb

## 2016-11-27 DIAGNOSIS — I471 Supraventricular tachycardia: Secondary | ICD-10-CM | POA: Diagnosis not present

## 2016-11-27 DIAGNOSIS — I10 Essential (primary) hypertension: Secondary | ICD-10-CM | POA: Diagnosis not present

## 2016-11-27 NOTE — Progress Notes (Signed)
Electrophysiology Office Note   Date:  11/27/2016   ID:  Walter Gonzales, DOB 12-Apr-1965, MRN [id number]  PCP:  Shelbie Ammons, MD  Cardiologist:  Dr Katrinka Blazing Primary Electrophysiologist: Hillis Range, MD    Chief Complaint  Patient presents with  . New Patient (Initial Visit)    SVT     History of Present Illness: Walter Gonzales is a 51 y.o. male who presents today for electrophysiology evaluation.   He is referred by Dr Katrinka Blazing for EP consultation regarding SVT.  He also has a h/o morbid obesity, HTN, DM, and CAD. He presented to [facility] 10/04/16 (records reviewed) with abrupt onset SVT at 210 bpm.  His tachycardia terminated with adenosine.  He reports similar episodes of abrupt onset/ offset of tachypalpitations over the past 2 years.  Episodes will typically be associated with diapohresis, weakness, and chest discomfort.  He finds that with cough, episodes will occasionally terminate.  This most recent episode occurred at night.  He has a sleep study pending.  He was placed on metoprolol and has had no further episodes.  Today, he denies symptoms of palpitations, chest pain, shortness of breath,  lower extremity edema, claudication, dizziness, presyncope, syncope, bleeding, or neurologic sequela. + he does have trouble lying flat.  The patient is tolerating medications without difficulties and is otherwise without complaint today.    Past Medical History:  Diagnosis Date  . Diabetes mellitus without complication (HCC)   . Gout   . Hypercholesterolemia   . Hypertension   . Morbid obesity (HCC)   . SVT (supraventricular tachycardia) (HCC)    adenosine sensitive short RP SVT  . Thyroid disease    Hypothyroidism   Past Surgical History:  Procedure Laterality Date  . ACHILLES TENDON REPAIR Right      Current Outpatient Prescriptions  Medication Sig Dispense Refill  . empagliflozin (JARDIANCE) 10 MG TABS tablet Take 10 mg by mouth daily.    . hydrochlorothiazide (MICROZIDE)  12.5 MG capsule Take 1 capsule (12.5 mg total) by mouth daily. 90 capsule 1  . levothyroxine (SYNTHROID, LEVOTHROID) 100 MCG tablet Take 100 mcg by mouth daily before breakfast.    . losartan (COZAAR) 100 MG tablet Take 1 tablet (100 mg total) by mouth daily. 90 tablet 1  . meloxicam (MOBIC) 15 MG tablet Take 15 mg by mouth daily.    . metFORMIN (GLUCOPHAGE) 1000 MG tablet Take 1,000 mg by mouth 2 (two) times daily.    . metoprolol tartrate (LOPRESSOR) 50 MG tablet Take 1 tablet (50 mg total) by mouth 2 (two) times daily. 60 tablet 3  . terbinafine (LAMISIL) 250 MG tablet Take 250 mg by mouth daily.     . traMADol (ULTRAM) 50 MG tablet Take 50 mg by mouth daily as needed (pain).     . Vitamin D, Ergocalciferol, (DRISDOL) 50000 units CAPS capsule Take 50,000 Units by mouth 2 (two) times a week. Monday and Tuesday     No current facility-administered medications for this visit.     Allergies:   Patient has no known allergies.   Social History:  The patient  reports that he has never smoked. He has never used smokeless tobacco. He reports that he drinks alcohol. He reports that he does not use drugs.   Family History:  The patient's  family history includes Heart attack in his father and mother; Heart failure in his maternal grandmother.    ROS:  Please see the history of present illness.   All  other systems are personally reviewed and negative.    PHYSICAL EXAM: VS:  BP (!) 152/90   Pulse (!) 56   Ht 6\' 3"  (1.905 m)   Wt (!) 360 lb 3.2 oz (163.4 kg)   SpO2 98%   BMI 45.02 kg/m  , BMI Body mass index is 45.02 kg/m. GEN: morbidly obese, in no acute distress  HEENT: normal  Neck: no JVD, carotid bruits, or masses Cardiac: RRR; no murmurs, rubs, or gallops,no edema  Respiratory:  clear to auscultation bilaterally, normal work of breathing GI: soft, nontender, nondistended, + BS MS: no deformity or atrophy  Skin: warm and dry  Neuro:  Strength and sensation are intact Psych:  euthymic mood, full affect  EKG:  EKG tracings from 10/04/16 are personally reviewed and reveal short RP SVT at 216 bpm ekg 10/05/16 reveals sinus rhythm, no pre-excitation   Recent Labs: 10/04/2016: ALT 72; B Natriuretic Peptide 44.9; Hemoglobin 15.2; Magnesium 2.2; Platelets 224; TSH 2.097 10/05/2016: BUN 19; Creatinine, Ser 1.59; Potassium 4.0; Sodium 140  personally reviewed   Lipid Panel     Component Value Date/Time   CHOL 187 10/05/2016 0003   TRIG 251 (H) 10/05/2016 0003   HDL 55 10/05/2016 0003   CHOLHDL 3.4 10/05/2016 0003   VLDL 50 (H) 10/05/2016 0003   LDLCALC 82 10/05/2016 0003   personally reviewed   Wt Readings from Last 3 Encounters:  11/27/16 (!) 360 lb 3.2 oz (163.4 kg)  10/05/16 (!) 356 lb 6.4 oz (161.7 kg)      Other studies personally reviewed: Additional studies/ records that were reviewed today include: recent hospital records , echo 10/05/16 reveals EF 45-50% Review of the above records today demonstrates: as above   ASSESSMENT AND PLAN:  1.  SVT The patient has adenosine sensitive short RP SVT documented 10/04/16. Therapeutic strategies for supraventricular tachycardia including medicine and ablation were discussed in detail with the patient today. Risk, benefits, and alternatives to EP study and radiofrequency ablation were also discussed in detail today.  He is clear that he is not interested in ablation.  He prefers to continue on metoprolol.  2. Morbid obesity Lifestyle modification encouraged Body mass index is 45.02 kg/m.  3. HTN Stable No change required today  4. Reduced EF Likely due to very rapid SVT Repeat echo upon return Follow-up with general cardiology if remains depressed  5. Snoring Sleep study is pending  Return to see EP PA in 6 months    Signed, Hillis RangeJames Debbra Digiulio, MD  11/27/2016 11:50 AM     [facility] [address] [place] [address] [phone] (office) [phone] (fax)

## 2016-11-27 NOTE — Patient Instructions (Signed)
Medication Instructions:  Your physician recommends that you continue on your current medications as directed. Please refer to the Current Medication list given to you today.  -- If you need a refill on your cardiac medications before your next appointment, please call your pharmacy. --  Labwork: None ordered  Testing/Procedures: None ordered  Follow-Up: Your physician wants you to follow-up in: 6 months with Renee Ursuy PA.  You will receive a reminder letter in the mail two months in advance. If you don't receive a letter, please call our office to schedule the follow-up appointment.  Thank you for choosing [facility]!!   Giovana Faciane, RN [phone]  Any Other Special Instructions Will Be Listed Below (If Applicable).         

## 2016-12-18 ENCOUNTER — Encounter (HOSPITAL_BASED_OUTPATIENT_CLINIC_OR_DEPARTMENT_OTHER): Payer: Managed Care, Other (non HMO)

## 2017-02-04 ENCOUNTER — Other Ambulatory Visit: Payer: Self-pay | Admitting: Cardiology

## 2017-03-14 ENCOUNTER — Encounter (HOSPITAL_COMMUNITY): Payer: Self-pay | Admitting: Emergency Medicine

## 2017-03-14 ENCOUNTER — Emergency Department (HOSPITAL_COMMUNITY): Payer: Managed Care, Other (non HMO)

## 2017-03-14 ENCOUNTER — Emergency Department (HOSPITAL_BASED_OUTPATIENT_CLINIC_OR_DEPARTMENT_OTHER)
Admit: 2017-03-14 | Discharge: 2017-03-14 | Disposition: A | Discharge status: Home / Self Care | Payer: Managed Care, Other (non HMO) | Attending: Emergency Medicine | Admitting: Emergency Medicine

## 2017-03-14 ENCOUNTER — Emergency Department (HOSPITAL_COMMUNITY)
Admission: EM | Admit: 2017-03-14 | Discharge: 2017-03-14 | Disposition: A | Discharge status: Home / Self Care | Payer: Managed Care, Other (non HMO) | Attending: Emergency Medicine | Admitting: Emergency Medicine

## 2017-03-14 DIAGNOSIS — E039 Hypothyroidism, unspecified: Secondary | ICD-10-CM | POA: Insufficient documentation

## 2017-03-14 DIAGNOSIS — R05 Cough: Secondary | ICD-10-CM | POA: Insufficient documentation

## 2017-03-14 DIAGNOSIS — Z7982 Long term (current) use of aspirin: Secondary | ICD-10-CM | POA: Diagnosis not present

## 2017-03-14 DIAGNOSIS — R002 Palpitations: Secondary | ICD-10-CM

## 2017-03-14 DIAGNOSIS — Z79899 Other long term (current) drug therapy: Secondary | ICD-10-CM | POA: Insufficient documentation

## 2017-03-14 DIAGNOSIS — M7989 Other specified soft tissue disorders: Secondary | ICD-10-CM

## 2017-03-14 DIAGNOSIS — M79604 Pain in right leg: Secondary | ICD-10-CM | POA: Diagnosis not present

## 2017-03-14 DIAGNOSIS — N183 Chronic kidney disease, stage 3 (moderate): Secondary | ICD-10-CM | POA: Insufficient documentation

## 2017-03-14 DIAGNOSIS — Z7984 Long term (current) use of oral hypoglycemic drugs: Secondary | ICD-10-CM | POA: Diagnosis not present

## 2017-03-14 DIAGNOSIS — I129 Hypertensive chronic kidney disease with stage 1 through stage 4 chronic kidney disease, or unspecified chronic kidney disease: Secondary | ICD-10-CM | POA: Insufficient documentation

## 2017-03-14 DIAGNOSIS — I471 Supraventricular tachycardia: Secondary | ICD-10-CM | POA: Insufficient documentation

## 2017-03-14 DIAGNOSIS — E119 Type 2 diabetes mellitus without complications: Secondary | ICD-10-CM | POA: Diagnosis not present

## 2017-03-14 LAB — I-STAT TROPONIN, ED
TROPONIN I, POC: 0 ng/mL (ref 0.00–0.08)
Troponin i, poc: 0.01 ng/mL (ref 0.00–0.08)

## 2017-03-14 LAB — CBC
HCT: 47.2 % (ref 39.0–52.0)
HEMOGLOBIN: 14.9 g/dL (ref 13.0–17.0)
MCH: 25.7 pg — AB (ref 26.0–34.0)
MCHC: 31.6 g/dL (ref 30.0–36.0)
MCV: 81.4 fL (ref 78.0–100.0)
PLATELETS: 205 10*3/uL (ref 150–400)
RBC: 5.8 MIL/uL (ref 4.22–5.81)
RDW: 16.4 % — ABNORMAL HIGH (ref 11.5–15.5)
WBC: 6.7 10*3/uL (ref 4.0–10.5)

## 2017-03-14 LAB — BASIC METABOLIC PANEL
ANION GAP: 14 (ref 5–15)
BUN: 17 mg/dL (ref 6–20)
CALCIUM: 9.5 mg/dL (ref 8.9–10.3)
CO2: 21 mmol/L — AB (ref 22–32)
CREATININE: 1.5 mg/dL — AB (ref 0.61–1.24)
Chloride: 103 mmol/L (ref 101–111)
GFR calc non Af Amer: 52 mL/min — ABNORMAL LOW (ref >60)
Glucose, Bld: 186 mg/dL — ABNORMAL HIGH (ref 65–99)
Potassium: 4.1 mmol/L (ref 3.5–5.1)
SODIUM: 138 mmol/L (ref 135–145)

## 2017-03-14 LAB — D-DIMER, QUANTITATIVE: D-Dimer, Quant: 0.36 ug/mL-FEU (ref 0.00–0.50)

## 2017-03-14 NOTE — ED Notes (Signed)
This RN called lab, they will add on the d-dimer to the existing blood in lab.

## 2017-03-14 NOTE — ED Triage Notes (Signed)
Pt reports SOB, diaphoresis, coughing, chest pressure onset last evening. Pt states he feels the same way as when he was in SVT. Pt hypertensive in triage.

## 2017-03-14 NOTE — ED Notes (Signed)
Pt transported to Vascular US 

## 2017-03-14 NOTE — ED Notes (Signed)
Pt returned to room at this time

## 2017-03-14 NOTE — Discharge Instructions (Signed)
Please call and follow-up with your primary care provider for further management of your condition.  Return if you have any concern.

## 2017-03-14 NOTE — ED Provider Notes (Signed)
[facility] EMERGENCY DEPARTMENT Provider Note   CSN: [id number] Arrival date & time: 03/14/17  0458     History   Chief Complaint Chief Complaint  Patient presents with  . Shortness of Breath    HPI Walter Gonzales is a 52 y.o. male.  HPI   52 year old morbidly obese male with history of diabetes, hypertension, recurrent SVT, thyroid disease presenting complaining of heart palpitation.  Patient report he was awoke around 230 this morning due to persistent cough.  While coughing he noticed his heart rate was fast, developed some pleuritic chest discomfort,  shortness of breath, and became diaphoretic.  He went to the bathroom, had a bowel movement which he felt symptoms did improve mildly but came back.  He took his morning medication include metoprolol and contacted EMS.  Once he presents to the ED, his symptoms resolved.  He felt that his symptoms is similar to a prior SVT episode last September.  He has been having recurrent sinus congestion, and nonproductive cough ongoing for the past 3 weeks.  He did discuss this with his primary care doctor and was recently started on a Z-Pak, finished it 2 days ago.  He denies having any active fever, cough is nonproductive, no complaint of headache, abdominal pain, back pain or leg swelling.  He did report intermittent right-sided calf discomfort and thigh discomfort for the past 6-41months.  He denies any prior history of PE or DVT, no recent surgery, prolonged bed rest, active cancer or hemoptysis.     Past Medical History:  Diagnosis Date  . Diabetes mellitus without complication (HCC)   . Gout   . Hypercholesterolemia   . Hypertension   . Morbid obesity (HCC)   . SVT (supraventricular tachycardia) (HCC)    adenosine sensitive short RP SVT  . Thyroid disease    Hypothyroidism    Patient Active Problem List   Diagnosis Date Noted  . SVT (supraventricular tachycardia) (HCC) 10/04/2016  . Elevated troponin 10/04/2016    . Morbid obesity (HCC) 10/04/2016  . Type 2 diabetes mellitus (HCC) 10/04/2016  . Snoring 10/04/2016  . Essential hypertension 10/04/2016  . Tachycardia 10/04/2016  . CKD (chronic kidney disease), stage III (HCC) 10/04/2016    Past Surgical History:  Procedure Laterality Date  . ACHILLES TENDON REPAIR Right        Home Medications    Prior to Admission medications   Medication Sig Start Date End Date Taking? Authorizing Provider  aspirin EC 81 MG tablet Take 81 mg by mouth once.   Yes [provider]  empagliflozin (JARDIANCE) 10 MG TABS tablet Take 10 mg by mouth daily.   Yes [provider]  hydrochlorothiazide (MICROZIDE) 12.5 MG capsule Take 1 capsule (12.5 mg total) by mouth daily. 10/06/16  Yes Laverda Page B, NP  levothyroxine (SYNTHROID, LEVOTHROID) 100 MCG tablet Take 100 mcg by mouth daily before breakfast.   Yes [provider]  losartan (COZAAR) 100 MG tablet Take 1 tablet (100 mg total) by mouth daily. 10/06/16  Yes Laverda Page B, NP  meloxicam (MOBIC) 15 MG tablet Take 15 mg by mouth daily.   Yes [provider]  metFORMIN (GLUCOPHAGE) 1000 MG tablet Take 1,000 mg by mouth 2 (two) times daily. 11/20/16  Yes [provider]  metoprolol tartrate (LOPRESSOR) 50 MG tablet TAKE 1 TABLET BY MOUTH TWICE DAILY 02/05/17  Yes Laverda Page B, NP  terbinafine (LAMISIL) 250 MG tablet Take 250 mg by mouth daily.  11/17/16  Yes [provider]  traMADol (ULTRAM) 50 MG tablet Take 50 mg by mouth daily as needed (pain).  11/23/16  Yes [provider]  Vitamin D, Ergocalciferol, (DRISDOL) 50000 units CAPS capsule Take 50,000 Units by mouth 2 (two) times a week. Monday and Tuesday   Yes [provider]    Family History Family History  Problem Relation Age of Onset  . Heart attack Mother   . Heart attack Father   . Heart failure Maternal Grandmother     Social History Social History   Tobacco Use   . Smoking status: Never Smoker  . Smokeless tobacco: Never Used  Substance Use Topics  . Alcohol use: Yes    Comment: occ  . Drug use: No     Allergies   Patient has no known allergies.   Review of Systems Review of Systems  All other systems reviewed and are negative.    Physical Exam Updated Vital Signs BP (!) 183/103 (BP Location: Left Arm)   Pulse 65   Temp 98.4 F (36.9 C) (Oral)   Resp 20   Ht 6\' 3"  (1.905 m)   Wt (!) 163.3 kg (360 lb)   SpO2 100%   BMI 45.00 kg/m   Physical Exam  Constitutional: He appears well-developed and well-nourished. No distress.  Obese  male, resting in bed comfortably in no acute distress.  HENT:  Head: Atraumatic.  Eyes: Conjunctivae are normal.  Neck: Neck supple. No JVD present.  Cardiovascular: Normal rate and regular rhythm. Exam reveals no gallop.  No murmur heard. Pulmonary/Chest: Effort normal and breath sounds normal. He has no wheezes.  Abdominal: Soft. Bowel sounds are normal. There is no tenderness.  Musculoskeletal:       Right lower leg: He exhibits no edema.       Left lower leg: He exhibits no edema.  Neurological: He is alert.  Skin: No rash noted.  Psychiatric: He has a normal mood and affect.  Nursing note and vitals reviewed.    ED Treatments / Results  Labs (all labs ordered are listed, but only abnormal results are displayed) Labs Reviewed  BASIC METABOLIC PANEL - Abnormal; Notable for the following components:      Result Value   CO2 21 (*)    Glucose, Bld 186 (*)    Creatinine, Ser 1.50 (*)    GFR calc non Af Amer 52 (*)    All other components within normal limits  CBC - Abnormal; Notable for the following components:   MCH 25.7 (*)    RDW 16.4 (*)    All other components within normal limits  D-DIMER, QUANTITATIVE (NOT AT [facility])  I-STAT TROPONIN, ED  I-STAT TROPONIN, ED    EKG  EKG Interpretation  Date/Time:  Wednesday March 14 2017 05:02:30 EST Ventricular Rate:  68 PR  Interval:  174 QRS Duration: 108 QT Interval:  428 QTC Calculation: 455 R Axis:   -47 Text Interpretation:  Normal sinus rhythm Incomplete right bundle branch block Left anterior fascicular block Nonspecific ST abnormality Abnormal ECG No significant change since last tracing Confirmed by Rochele RaringWard, Kristen [id number]) on 03/14/2017 5:24:33 AM       Radiology Dg Chest 2 View  Result Date: 03/14/2017 CLINICAL DATA:  Chest pain and shortness of breath. EXAM: CHEST  2 VIEW COMPARISON:  10/04/2016 FINDINGS: The cardiomediastinal contours are unchanged with aortic tortuosity. Pulmonary vasculature is normal. No consolidation, pleural effusion, or pneumothorax. No acute osseous abnormalities are seen. Mild degenerative change  in the midthoracic spine. IMPRESSION: No active cardiopulmonary disease. Electronically Signed   By: Rubye Oaks M.D.   On: 03/14/2017 05:42    Procedures Procedures (including critical care time)  Medications Ordered in ED Medications - No data to display   Initial Impression / Assessment and Plan / ED Course  I have reviewed the triage vital signs and the nursing notes.  Pertinent labs & imaging results that were available during my care of the patient were reviewed by me and considered in my medical decision making (see chart for details).     BP 135/76   Pulse 68   Temp 98.4 F (36.9 C) (Oral)   Resp (!) 24   Ht 6\' 3"  (1.905 m)   Wt (!) 163.3 kg (360 lb)   SpO2 98%   BMI 45.00 kg/m    Final Clinical Impressions(s) / ED Diagnoses   Final diagnoses:  Heart palpitations    ED Discharge Orders    None     6:57 AM This is an obese male with prior history of SVT currently on metoprolol who developed an episode of heart palpitation earlier this morning.  Symptom seems to be resolving as he is no longer tachycardic on the monitor.  He did report having a bowel movement as well as taking his metoprolol which may has help reset his palpitation.  He did  mention some chest discomfort, and also report having some shortness of breath.  I am unable to rule out PE using Wells criteria therefore a d-dimer will be obtained. Will also obtain doppler US of RLE to r/o DVT.  Pt will also benefit from delta trop.  Care discussed with DR. Ward.   9:50 AM Normal serial troponin, no active chest pain his symptom has  resolved.  Labs are at baseline.  Negative d-dimer therefore low suspicion for PE.  Ultrasound of right lower extremity without evidence of DVT.  EKG without acute ischemic changes.  At this time, patient is stable for discharge.  I instructed patient on vagal technique to help alleviate SVT should arise.  Return precautions discussed.  Patient will follow up with his primary care provider for further management.   Fayrene Helper, PA-C 03/14/17 0953    Ward, Layla Maw, DO 03/15/17 1610

## 2017-03-14 NOTE — Progress Notes (Signed)
Right lower extremity venous duplex has been completed. Negative for DVT. Results were given to Fayrene HelperBowie Tran PA.  03/14/17 8:15 AM Olen CordialGreg Camryn Quesinberry RVT

## 2017-04-01 ENCOUNTER — Other Ambulatory Visit: Payer: Self-pay | Admitting: Cardiology

## 2017-04-16 ENCOUNTER — Encounter: Payer: Self-pay | Admitting: *Deleted

## 2017-04-16 NOTE — Telephone Encounter (Signed)
No contact letter sent

## 2017-04-16 NOTE — Telephone Encounter (Deleted)
Conversation  (Newest Message First)  Mesiemore, Marcelle SmilingKimberly D 11/08/16 10:43 AM    Left pt voicemail to return my call need to speak withhim about release form he has signed.

## 2017-04-16 NOTE — Telephone Encounter (Signed)
   (  Newest Message First)  Mesiemore, Marcelle SmilingKimberly D 11/08/16 10:43 AM    Left pt voicemail to return my call need to speak withhim about release form he has signed.

## 2017-04-16 NOTE — Telephone Encounter (Signed)
Late Entry: Patient called to say his insurance Counselling [employer]) needed our office to fax office notes and an order over to them. PAP assistant reached out to the patient to clarify exactly what he needed on 3 different occasions and left a messages to call back but he has not returned my calls yet. PAP assistant reached out to [facility] records because she would need him to sign a release form and her call has gone unanswered as well. PAP assistant will send him a no contact letter to try to reach him.

## 2017-04-16 NOTE — Telephone Encounter (Signed)
Walter SacramentoPrice, Walter Gonzales    New message  Pt is due to have a sleep study in oct.  He was told by his ins company to have us fax an order and clinicals to [phone].  He has [employer] insurance

## 2017-11-22 ENCOUNTER — Other Ambulatory Visit: Payer: Self-pay | Admitting: Cardiology

## 2018-01-07 ENCOUNTER — Other Ambulatory Visit: Payer: Self-pay | Admitting: Internal Medicine

## 2018-04-07 ENCOUNTER — Other Ambulatory Visit: Payer: Self-pay | Admitting: Internal Medicine

## 2018-05-12 ENCOUNTER — Other Ambulatory Visit: Payer: Self-pay | Admitting: Internal Medicine

## 2018-08-05 ENCOUNTER — Other Ambulatory Visit: Payer: Self-pay | Admitting: Internal Medicine

## 2018-08-08 NOTE — Telephone Encounter (Signed)
I saw him only for SVT.  He should establish with general cardiology to evaluate and further adjust medicines.  In the interim, primary care can fill.  Do not refill medicine as he has not been seen since 2018.  Thompson Grayer MD, [place] 08/08/2018 8:38 AM

## 2018-08-12 ENCOUNTER — Other Ambulatory Visit: Payer: Self-pay | Admitting: Internal Medicine

## 2018-08-19 ENCOUNTER — Other Ambulatory Visit: Payer: Self-pay | Admitting: Internal Medicine

## 2018-08-19 NOTE — Telephone Encounter (Signed)
Outpatient Medication Detail   Disp Refills Start End   losartan (COZAAR) 100 MG tablet 90 tablet 0 08/12/2018    Sig: TAKE 1 TABLET BY MOUTH ONCE DAILY. OFFICE VISIT FOR REFILLS   Sent to pharmacy as: losartan (COZAAR) 100 MG tablet   E-Prescribing Status: Receipt confirmed by pharmacy (08/12/2018 9:36 AM EDT)   Pharmacy  [id number] [place] [address]), [place]

## 2018-10-07 ENCOUNTER — Other Ambulatory Visit: Payer: Self-pay | Admitting: Internal Medicine

## 2018-10-14 ENCOUNTER — Other Ambulatory Visit: Payer: Self-pay | Admitting: Internal Medicine

## 2018-10-15 ENCOUNTER — Other Ambulatory Visit: Payer: Self-pay | Admitting: Internal Medicine

## 2018-12-28 ENCOUNTER — Emergency Department (HOSPITAL_COMMUNITY): Payer: BC Managed Care – PPO

## 2018-12-28 ENCOUNTER — Emergency Department (HOSPITAL_COMMUNITY)
Admission: EM | Admit: 2018-12-28 | Discharge: 2018-12-28 | Disposition: A | Discharge status: Home / Self Care | Payer: BC Managed Care – PPO | Attending: Emergency Medicine | Admitting: Emergency Medicine

## 2018-12-28 DIAGNOSIS — Z7984 Long term (current) use of oral hypoglycemic drugs: Secondary | ICD-10-CM | POA: Diagnosis not present

## 2018-12-28 DIAGNOSIS — I129 Hypertensive chronic kidney disease with stage 1 through stage 4 chronic kidney disease, or unspecified chronic kidney disease: Secondary | ICD-10-CM | POA: Insufficient documentation

## 2018-12-28 DIAGNOSIS — N183 Chronic kidney disease, stage 3 unspecified: Secondary | ICD-10-CM | POA: Insufficient documentation

## 2018-12-28 DIAGNOSIS — R0602 Shortness of breath: Secondary | ICD-10-CM | POA: Diagnosis not present

## 2018-12-28 DIAGNOSIS — E1122 Type 2 diabetes mellitus with diabetic chronic kidney disease: Secondary | ICD-10-CM | POA: Diagnosis not present

## 2018-12-28 DIAGNOSIS — R079 Chest pain, unspecified: Secondary | ICD-10-CM

## 2018-12-28 DIAGNOSIS — Z7982 Long term (current) use of aspirin: Secondary | ICD-10-CM | POA: Insufficient documentation

## 2018-12-28 DIAGNOSIS — R0789 Other chest pain: Secondary | ICD-10-CM | POA: Diagnosis not present

## 2018-12-28 DIAGNOSIS — Z79899 Other long term (current) drug therapy: Secondary | ICD-10-CM | POA: Insufficient documentation

## 2018-12-28 DIAGNOSIS — E039 Hypothyroidism, unspecified: Secondary | ICD-10-CM | POA: Insufficient documentation

## 2018-12-28 LAB — CBC
HCT: 49.7 % (ref 39.0–52.0)
Hemoglobin: 15.7 g/dL (ref 13.0–17.0)
MCH: 26.2 pg (ref 26.0–34.0)
MCHC: 31.6 g/dL (ref 30.0–36.0)
MCV: 82.8 fL (ref 80.0–100.0)
Platelets: 221 10*3/uL (ref 150–400)
RBC: 6 MIL/uL — ABNORMAL HIGH (ref 4.22–5.81)
RDW: 17.6 % — ABNORMAL HIGH (ref 11.5–15.5)
WBC: 7.7 10*3/uL (ref 4.0–10.5)
nRBC: 0 % (ref 0.0–0.2)

## 2018-12-28 LAB — BASIC METABOLIC PANEL
Anion gap: 15 (ref 5–15)
BUN: 15 mg/dL (ref 6–20)
CO2: 26 mmol/L (ref 22–32)
Calcium: 9.9 mg/dL (ref 8.9–10.3)
Chloride: 101 mmol/L (ref 98–111)
Creatinine, Ser: 1.7 mg/dL — ABNORMAL HIGH (ref 0.61–1.24)
GFR calc Af Amer: 52 mL/min — ABNORMAL LOW (ref >60)
GFR calc non Af Amer: 45 mL/min — ABNORMAL LOW (ref >60)
Glucose, Bld: 172 mg/dL — ABNORMAL HIGH (ref 70–99)
Potassium: 4.1 mmol/L (ref 3.5–5.1)
Sodium: 142 mmol/L (ref 135–145)

## 2018-12-28 LAB — TROPONIN I (HIGH SENSITIVITY)
Troponin I (High Sensitivity): 9 ng/L (ref <18)
Troponin I (High Sensitivity): 9 ng/L (ref <18)

## 2018-12-28 MED ORDER — SODIUM CHLORIDE 0.9% FLUSH: 3.0000 mL | Freq: Once | INTRAVENOUS | Status: DC

## 2018-12-28 MED ORDER — HYDROCHLOROTHIAZIDE 12.5 MG PO CAPS
12.5000 mg | ORAL_CAPSULE | Freq: Every day | ORAL | Status: DC
  Administered 2018-12-28: 10:00:00 12.5 mg via ORAL
  Filled 2018-12-28: qty 1

## 2018-12-28 MED ORDER — LOSARTAN POTASSIUM 50 MG PO TABS
100.0000 mg | ORAL_TABLET | Freq: Every day | ORAL | Status: DC
  Administered 2018-12-28: 10:00:00 100 mg via ORAL
  Filled 2018-12-28: qty 2

## 2018-12-28 NOTE — ED Triage Notes (Signed)
Pt came in POV with c/o of CP and SOB. Chest pain came first around 0400 today followed by SOB and sweating. Pt has been having the sweating episodes randomly the past couple days. Pt has a family hx to cardiac issues and is worried.

## 2018-12-28 NOTE — ED Provider Notes (Signed)
[facility] Provider Note   CSN: [id number] Arrival date & time: 12/28/18  0510     History   Chief Complaint Chief Complaint  Patient presents with   Chest Pain   Shortness of Breath    HPI Walter Gonzales is a 53 y.o. male.  HPI: A 53 year old patient with a history of treated diabetes, hypertension, hypercholesterolemia and obesity presents for evaluation of chest pain. Initial onset of pain was approximately 3-6 hours ago. The patient's chest pain is well-localized, is described as heaviness/pressure/tightness and is not worse with exertion. The patient reports some diaphoresis. The patient's chest pain is middle- or left-sided, is not sharp and does not radiate to the arms/jaw/neck. The patient does not complain of nausea. The patient has no history of stroke, has no history of peripheral artery disease, has not smoked in the past 90 days and has no relevant family history of coronary artery disease (first degree relative at less than age 54).   53 year old male presents from home with wife at bedside, past medical history of diabetes, hypertension, hyperlipidemia, SVT, non-smoker with complaint of diaphoresis and chest discomfort.  Patient states that for the past week and a half or so which he will start to feel very anxious and break out in a sweat, this usually occurs in the evenings.  Patient states he went bowling last night, came home at 4 AM and took an Alka-Seltzer to try and prevent infection.  Patient later developed midsternal/epigastric pressure and a cold sweat and was concerned that this may be his heart.  Patient states that he has episodes similar to this about twice a year, states last time he came to the hospital for this he was in SVT.  Patient states his symptoms resolved as soon as he walked into the emergency room tonight although he does have ongoing slight epigastric discomfort, questions if his episodes are anxiety related.   Symptoms do not radiate, denies neck/back/extremity pain, nothing makes symptoms better or worse. No pain with exertion.  This discomfort is described as needing to belch or have a bowel movement.  Denies shortness of breath, nausea, vomiting.  No other complaints or concerns.     Past Medical History:  Diagnosis Date   Diabetes mellitus without complication (HCC)    Gout    Hypercholesterolemia    Hypertension    Morbid obesity (HCC)    SVT (supraventricular tachycardia) (HCC)    adenosine sensitive short RP SVT   Thyroid disease    Hypothyroidism    Patient Active Problem List   Diagnosis Date Noted   SVT (supraventricular tachycardia) (HCC) 10/04/2016   Elevated troponin 10/04/2016   Morbid obesity (HCC) 10/04/2016   Type 2 diabetes mellitus (HCC) 10/04/2016   Snoring 10/04/2016   Essential hypertension 10/04/2016   Tachycardia 10/04/2016   CKD (chronic kidney disease), stage III 10/04/2016    Past Surgical History:  Procedure Laterality Date   ACHILLES TENDON REPAIR Right         Home Medications    Prior to Admission medications   Medication Sig Start Date End Date Taking? Authorizing Provider  aspirin EC 81 MG tablet Take 81 mg by mouth once.    [provider]  empagliflozin (JARDIANCE) 10 MG TABS tablet Take 10 mg by mouth daily.    [provider]  hydrochlorothiazide (MICROZIDE) 12.5 MG capsule Take 1 capsule by mouth once daily 10/14/18   Allred, Fayrene FearingJames, MD  levothyroxine (SYNTHROID, LEVOTHROID) 100 MCG  tablet Take 100 mcg by mouth daily before breakfast.    [provider]  losartan (COZAAR) 100 MG tablet TAKE 1 TABLET BY MOUTH ONCE DAILY. OFFICE VISIT FOR REFILLS 08/12/18   Allred, Jeneen Rinks, MD  meloxicam (MOBIC) 15 MG tablet Take 15 mg by mouth daily.    [provider]  metFORMIN (GLUCOPHAGE) 1000 MG tablet Take 1,000 mg by mouth 2 (two) times daily. 11/20/16   [provider]  metoprolol tartrate  (LOPRESSOR) 50 MG tablet Take 1 tablet (50 mg total) by mouth 2 (two) times daily. Please make overdue appt with Dr. Rayann Heman before anymore refills. 1st attempt 11/22/17   Thompson Grayer, MD  terbinafine (LAMISIL) 250 MG tablet Take 250 mg by mouth daily.  11/17/16   [provider]  traMADol (ULTRAM) 50 MG tablet Take 50 mg by mouth daily as needed (pain).  11/23/16   [provider]  Vitamin D, Ergocalciferol, (DRISDOL) 50000 units CAPS capsule Take 50,000 Units by mouth 2 (two) times a week. Monday and Tuesday    [provider]    Family History Family History  Problem Relation Age of Onset   Heart attack Mother    Heart attack Father    Heart failure Maternal Grandmother     Social History Social History   Tobacco Use   Smoking status: Never Smoker   Smokeless tobacco: Never Used  Substance Use Topics   Alcohol use: Yes    Comment: occ   Drug use: No     Allergies   Patient has no known allergies.   Review of Systems Review of Systems  Constitutional: Positive for diaphoresis. Negative for fever.  Respiratory: Negative for shortness of breath.   Cardiovascular: Positive for chest pain. Negative for palpitations and leg swelling.  Gastrointestinal: Negative for abdominal pain, constipation, diarrhea, nausea and vomiting.  Musculoskeletal: Negative for arthralgias, back pain, myalgias, neck pain and neck stiffness.  Skin: Negative for rash and wound.  Allergic/Immunologic: Positive for immunocompromised state.  Neurological: Negative for dizziness, weakness and headaches.  Psychiatric/Behavioral: Negative for confusion.  All other systems reviewed and are negative.    Physical Exam Updated Vital Signs BP (!) 179/100    Pulse (!) 56    Temp 98.3 F (36.8 C) (Oral)    Resp 12    Ht 6\' 3"  (1.905 m)    Wt (!) 157.4 kg    SpO2 98%    BMI 43.37 kg/m   Physical Exam Vitals signs and nursing note reviewed.  Constitutional:       General: He is not in acute distress.    Appearance: He is well-developed. He is not diaphoretic.  HENT:     Head: Normocephalic and atraumatic.  Cardiovascular:     Rate and Rhythm: Normal rate and regular rhythm.     Heart sounds: Normal heart sounds. No murmur.  Pulmonary:     Effort: Pulmonary effort is normal.     Breath sounds: Normal breath sounds.  Chest:     Chest wall: No tenderness.  Abdominal:     Palpations: Abdomen is soft.     Tenderness: There is no abdominal tenderness.  Musculoskeletal:     Right lower leg: No edema.     Left lower leg: No edema.  Skin:    General: Skin is warm and dry.     Findings: No erythema or rash.  Neurological:     Mental Status: He is alert and oriented to person, place, and time.  Psychiatric:        Behavior: Behavior normal.      ED Treatments / Results  Labs (all labs ordered are listed, but only abnormal results are displayed) Labs Reviewed  BASIC METABOLIC PANEL - Abnormal; Notable for the following components:      Result Value   Glucose, Bld 172 (*)    Creatinine, Ser 1.70 (*)    GFR calc non Af Amer 45 (*)    GFR calc Af Amer 52 (*)    All other components within normal limits  CBC - Abnormal; Notable for the following components:   RBC 6.00 (*)    RDW 17.6 (*)    All other components within normal limits  TROPONIN I (HIGH SENSITIVITY)  TROPONIN I (HIGH SENSITIVITY)    EKG EKG Interpretation  Date/Time:  Saturday December 28 2018 05:19:30 EST Ventricular Rate:  61 PR Interval:  174 QRS Duration: 102 QT Interval:  452 QTC Calculation: 455 R Axis:   -65 Text Interpretation: Normal sinus rhythm Left axis deviation Incomplete right bundle branch block Possible Anterior infarct , age undetermined Abnormal ECG No significant change since last tracing Confirmed by Melene Plan [phone]) on 12/28/2018 8:33:25 AM   Radiology Dg Chest 2 View  Result Date: 12/28/2018 CLINICAL DATA:  Chest pain EXAM: CHEST - 2 VIEW  COMPARISON:  03/14/2017 FINDINGS: The heart size and mediastinal contours are within normal limits. Both lungs are clear. The visualized skeletal structures are unremarkable. IMPRESSION: No active cardiopulmonary disease. Electronically Signed   By: Deatra Robinson M.D.   On: 12/28/2018 06:09    Procedures Procedures (including critical care time)  Medications Ordered in ED Medications  sodium chloride flush (NS) 0.9 % injection 3 mL (3 mLs Intravenous Not Given 12/28/18 1038)  losartan (COZAAR) tablet 100 mg (100 mg Oral Given 12/28/18 1020)  hydrochlorothiazide (MICROZIDE) capsule 12.5 mg (12.5 mg Oral Given 12/28/18 1020)     Initial Impression / Assessment and Plan / ED Course  I have reviewed the triage vital signs and the nursing notes.  Pertinent labs & imaging results that were available during my care of the patient were reviewed by me and considered in my medical decision making (see chart for details).  Clinical Course as of Dec 27 1041  Sat Dec 28, 2018  [id number] 53 year old male with complaint of intermittent diaphoresis for the past week and a half, episode of epigastric/substernal pressure.  Symptoms have completely resolved.  Troponin x2 9/unchanged, CBC and BMP without significant changes from previous.  EKG without acute ischemic changes.  Patient was given regular dose of his blood pressure medication while in the ER.  Discussed results with patient and wife, offered admission for observation, patient would prefer to follow-up with his primary care provider but is agreeable to return to the ER if any of his symptoms return.   [LM]    Clinical Course User Index [LM] Alden Hipp    [facility] Score: 4 Final Clinical Impressions(s) / ED Diagnoses   Final diagnoses:  Chest pain, unspecified type    ED Discharge Orders    None       Jeannie Fend, PA-C 12/28/18 1043    Melene Plan, DO 12/28/18 1124

## 2018-12-28 NOTE — Discharge Instructions (Signed)
Follow up with your

## 2019-01-10 ENCOUNTER — Ambulatory Visit: Payer: BC Managed Care – PPO | Admitting: Student

## 2019-01-20 LAB — HM COLONOSCOPY

## 2021-06-18 IMAGING — CR DG CHEST 2V
2 series · 2 of 2 positions shown · non-contrast
Comparison: 03/14/2017

CLINICAL DATA: Chest pain

EXAM:
CHEST - 2 VIEW

[chest lat]
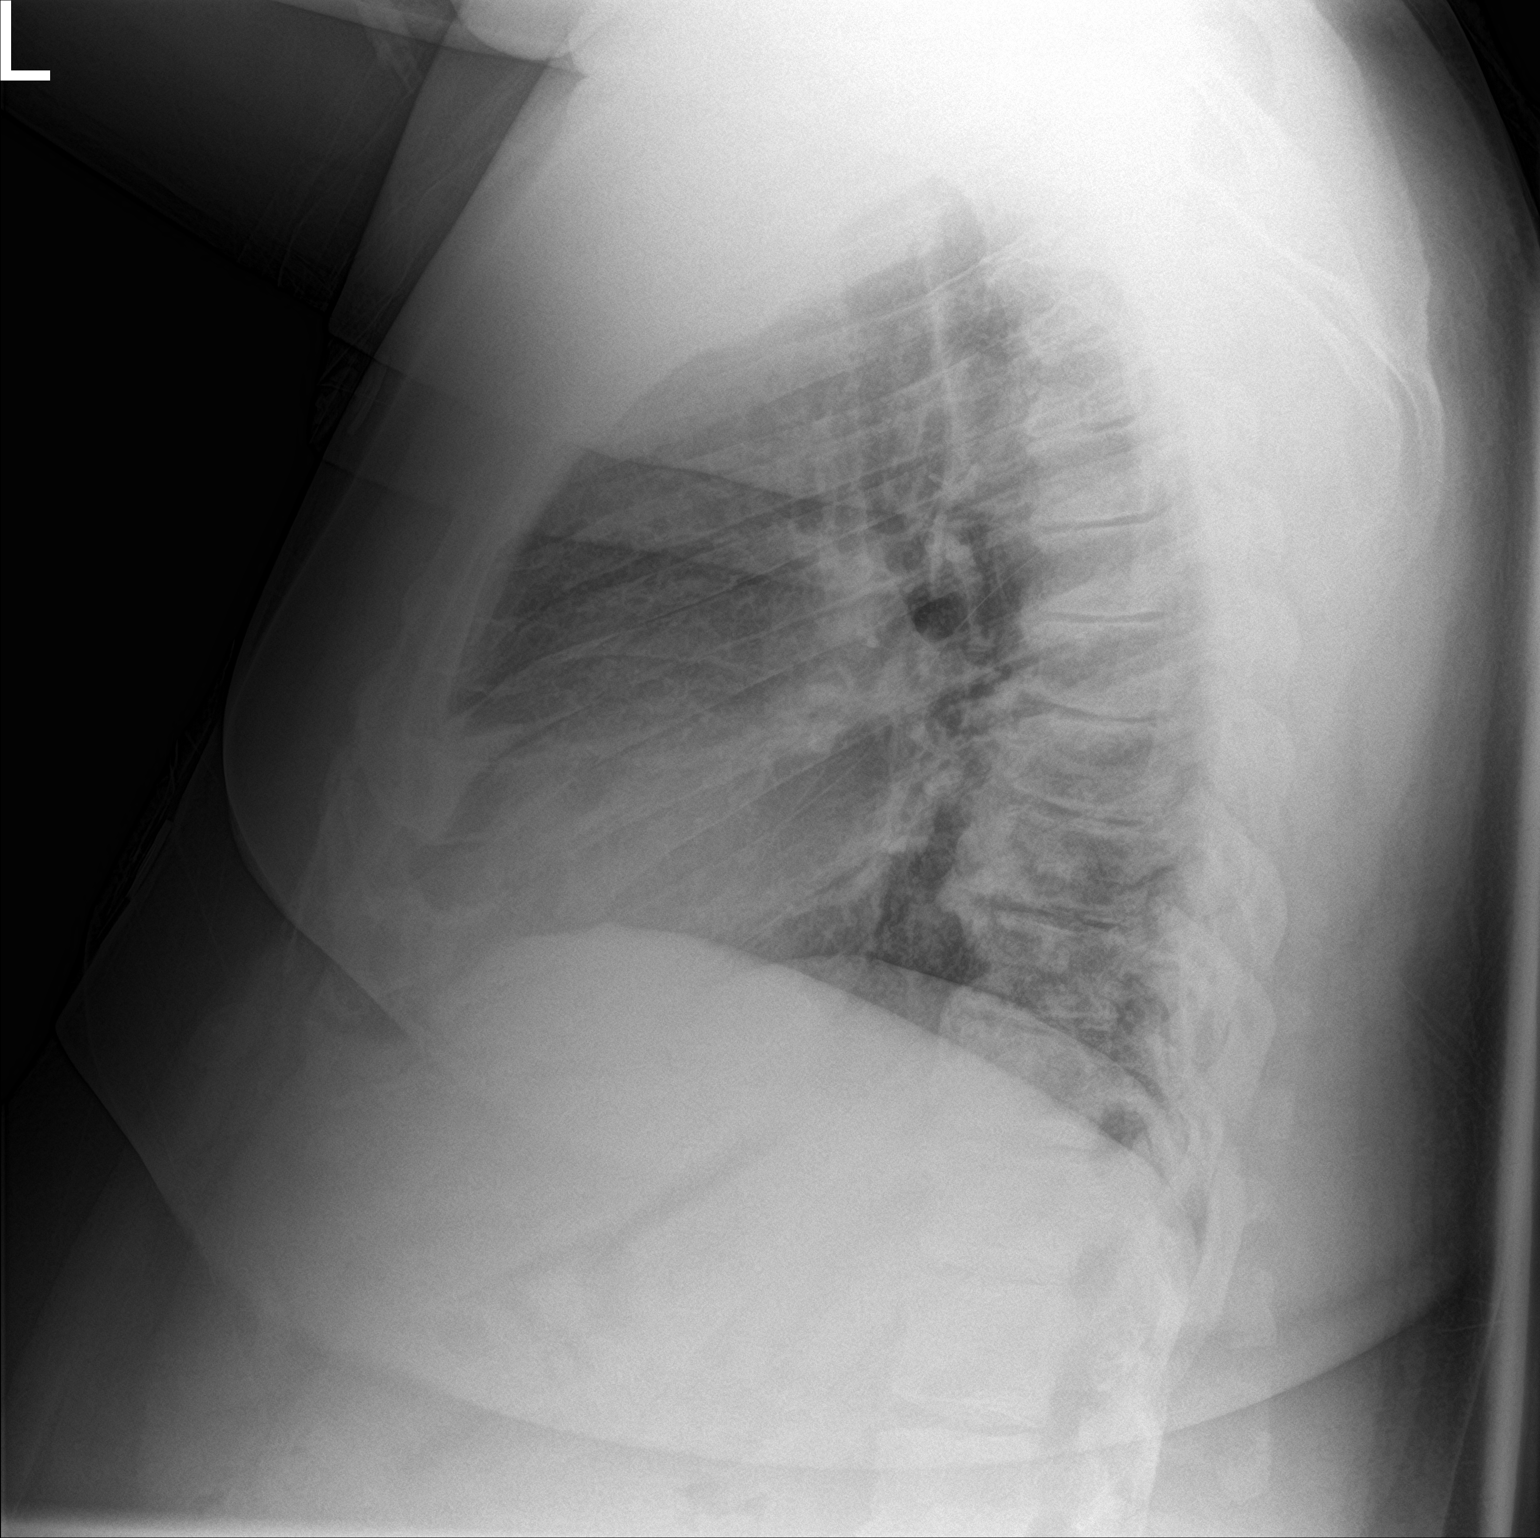

[chest ap]
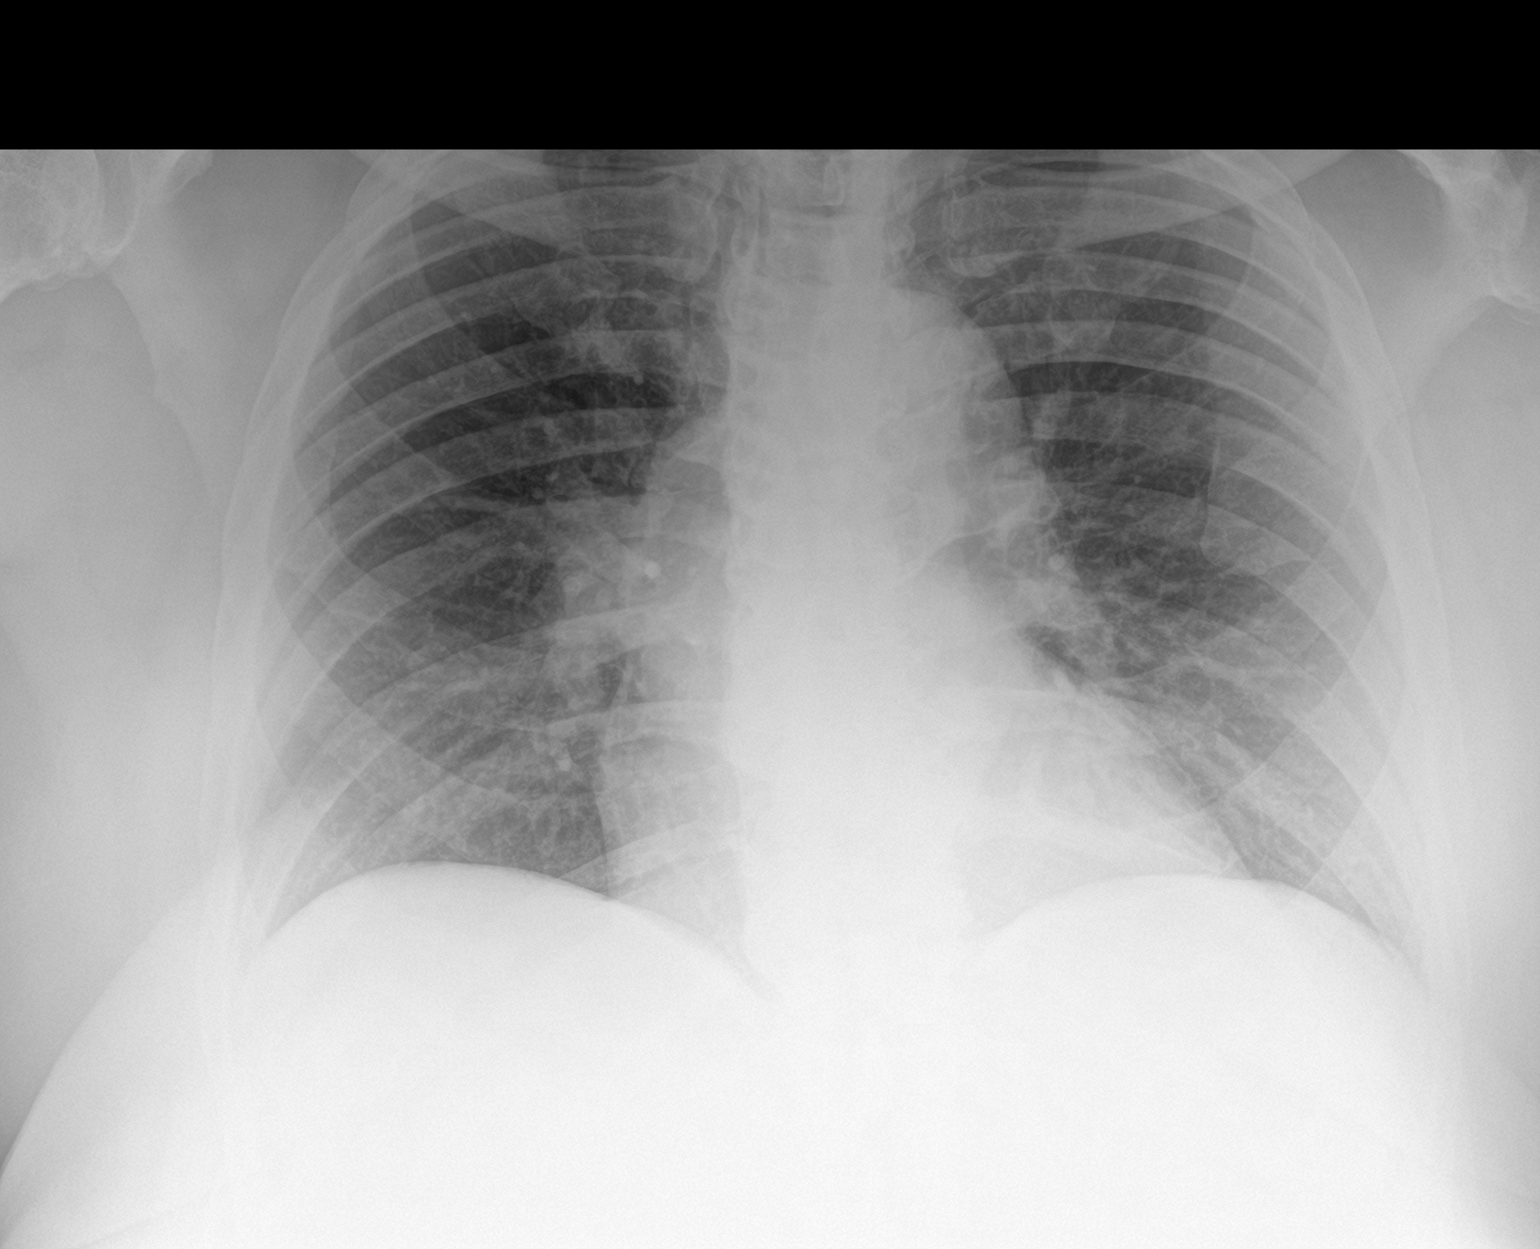

[2 of 2 positions shown; findings below may reference images not displayed]

FINDINGS: The heart size and mediastinal contours are within normal limits.
Both lungs are clear. The visualized skeletal structures are
unremarkable.
IMPRESSION: No active cardiopulmonary disease.

## 2021-09-01 ENCOUNTER — Emergency Department (HOSPITAL_COMMUNITY)
Admission: EM | Admit: 2021-09-01 | Discharge: 2021-09-01 | Disposition: A | Discharge status: Home / Self Care | Payer: No Typology Code available for payment source | Attending: Emergency Medicine | Admitting: Emergency Medicine

## 2021-09-01 ENCOUNTER — Other Ambulatory Visit: Payer: Self-pay

## 2021-09-01 ENCOUNTER — Emergency Department (HOSPITAL_COMMUNITY): Payer: No Typology Code available for payment source

## 2021-09-01 ENCOUNTER — Encounter (HOSPITAL_COMMUNITY): Payer: Self-pay | Admitting: Emergency Medicine

## 2021-09-01 ENCOUNTER — Ambulatory Visit
Admission: EM | Admit: 2021-09-01 | Discharge: 2021-09-01 | Disposition: A | Discharge status: Home / Self Care | Payer: No Typology Code available for payment source

## 2021-09-01 DIAGNOSIS — R531 Weakness: Secondary | ICD-10-CM | POA: Insufficient documentation

## 2021-09-01 DIAGNOSIS — Z7984 Long term (current) use of oral hypoglycemic drugs: Secondary | ICD-10-CM | POA: Insufficient documentation

## 2021-09-01 DIAGNOSIS — Z7982 Long term (current) use of aspirin: Secondary | ICD-10-CM | POA: Insufficient documentation

## 2021-09-01 DIAGNOSIS — I161 Hypertensive emergency: Secondary | ICD-10-CM

## 2021-09-01 DIAGNOSIS — Z79899 Other long term (current) drug therapy: Secondary | ICD-10-CM | POA: Diagnosis not present

## 2021-09-01 DIAGNOSIS — I10 Essential (primary) hypertension: Secondary | ICD-10-CM | POA: Diagnosis present

## 2021-09-01 DIAGNOSIS — E119 Type 2 diabetes mellitus without complications: Secondary | ICD-10-CM | POA: Insufficient documentation

## 2021-09-01 DIAGNOSIS — R21 Rash and other nonspecific skin eruption: Secondary | ICD-10-CM | POA: Insufficient documentation

## 2021-09-01 DIAGNOSIS — R7989 Other specified abnormal findings of blood chemistry: Secondary | ICD-10-CM | POA: Insufficient documentation

## 2021-09-01 DIAGNOSIS — I160 Hypertensive urgency: Secondary | ICD-10-CM | POA: Diagnosis not present

## 2021-09-01 LAB — URINALYSIS, ROUTINE W REFLEX MICROSCOPIC
Bacteria, UA: NONE SEEN
Bilirubin Urine: NEGATIVE
Glucose, UA: >=500 mg/dL — AB
Hgb urine dipstick: NEGATIVE
Ketones, ur: 20 mg/dL — AB
Leukocytes,Ua: NEGATIVE
Nitrite: NEGATIVE
Protein, ur: NEGATIVE mg/dL
Specific Gravity, Urine: 1.031 — ABNORMAL HIGH (ref 1.005–1.030)
pH: 5 (ref 5.0–8.0)

## 2021-09-01 LAB — CBC
HCT: 48.9 % (ref 39.0–52.0)
Hemoglobin: 15.2 g/dL (ref 13.0–17.0)
MCH: 25.2 pg — ABNORMAL LOW (ref 26.0–34.0)
MCHC: 31.1 g/dL (ref 30.0–36.0)
MCV: 81.1 fL (ref 80.0–100.0)
Platelets: 219 10*3/uL (ref 150–400)
RBC: 6.03 MIL/uL — ABNORMAL HIGH (ref 4.22–5.81)
RDW: 15.9 % — ABNORMAL HIGH (ref 11.5–15.5)
WBC: 7.9 10*3/uL (ref 4.0–10.5)
nRBC: 0 % (ref 0.0–0.2)

## 2021-09-01 LAB — COMPREHENSIVE METABOLIC PANEL
ALT: 32 U/L (ref 0–44)
AST: 24 U/L (ref 15–41)
Albumin: 4.1 g/dL (ref 3.5–5.0)
Alkaline Phosphatase: 91 U/L (ref 38–126)
Anion gap: 10 (ref 5–15)
BUN: 17 mg/dL (ref 6–20)
CO2: 24 mmol/L (ref 22–32)
Calcium: 9.6 mg/dL (ref 8.9–10.3)
Chloride: 105 mmol/L (ref 98–111)
Creatinine, Ser: 1.45 mg/dL — ABNORMAL HIGH (ref 0.61–1.24)
GFR, Estimated: 57 mL/min — ABNORMAL LOW (ref >60)
Glucose, Bld: 122 mg/dL — ABNORMAL HIGH (ref 70–99)
Potassium: 3.7 mmol/L (ref 3.5–5.1)
Sodium: 139 mmol/L (ref 135–145)
Total Bilirubin: 1.6 mg/dL — ABNORMAL HIGH (ref 0.3–1.2)
Total Protein: 7 g/dL (ref 6.5–8.1)

## 2021-09-01 LAB — TROPONIN I (HIGH SENSITIVITY)
Troponin I (High Sensitivity): 12 ng/L (ref <18)
Troponin I (High Sensitivity): 12 ng/L (ref <18)

## 2021-09-01 LAB — TSH: TSH: 2.171 u[IU]/mL (ref 0.350–4.500)

## 2021-09-01 MED ORDER — LABETALOL HCL 5 MG/ML IV SOLN
20.0000 mg | Freq: Once | INTRAVENOUS | Status: AC
  Administered 2021-09-01: 20 mg via INTRAVENOUS
  Filled 2021-09-01: qty 4

## 2021-09-01 MED ORDER — HYDROCHLOROTHIAZIDE 12.5 MG PO CAPS: 12.5000 mg | ORAL_CAPSULE | Freq: Every day | ORAL | 0 refills | Status: DC

## 2021-09-01 NOTE — ED Provider Notes (Signed)
[facility]    CSN: [id number] Arrival date & time: 09/01/21  1559      History   Chief Complaint Chief Complaint  Patient presents with   Weakness    HPI Walter Gonzales is a 56 y.o. male.   Patient presents today for concerns for high blood pressure.  He reports that he has been having some generalized weakness that started yesterday.  He has been taking his blood pressure medication as prescribed.  He does state that he missed his primary care appointment about 4 days ago.  Denies any associated headache, chest pain, shortness of breath, dizziness, blurred vision, nausea, vomiting.  Patient is also concerned for rash to left forearm that started about a week ago.  Patient was seen at PCP and prescribed triamcinolone cream with minimal improvement.  Rash is itchy only when he puts a cream on it.  Denies any associated fever.  Denies changes to environment including lotions, soaps, detergents, foods, etc.   Weakness   Past Medical History:  Diagnosis Date   Diabetes mellitus without complication (HCC)    Gout    Hypercholesterolemia    Hypertension    Morbid obesity ([place])    SVT (supraventricular tachycardia) (HCC)    adenosine sensitive short RP SVT   Thyroid disease    Hypothyroidism    Patient Active Problem List   Diagnosis Date Noted   SVT (supraventricular tachycardia) ([place]) 10/04/2016   Elevated troponin 10/04/2016   Morbid obesity ([place]) 10/04/2016   Type 2 diabetes mellitus ([place]) 10/04/2016   Snoring 10/04/2016   Essential hypertension 10/04/2016   Tachycardia 10/04/2016   CKD (chronic kidney disease), stage III ([place]) 10/04/2016    Past Surgical History:  Procedure Laterality Date   ACHILLES TENDON REPAIR Right        Home Medications    Prior to Admission medications   Medication Sig Start Date End Date Taking? Authorizing Provider  aspirin EC 81 MG tablet Take 81 mg by mouth once.    [provider]  empagliflozin  (JARDIANCE) 10 MG TABS tablet Take 10 mg by mouth daily.    [provider]  hydrochlorothiazide (MICROZIDE) 12.5 MG capsule Take 1 capsule by mouth once daily 10/14/18   Allred, Jeneen Rinks, MD  levothyroxine (SYNTHROID, LEVOTHROID) 100 MCG tablet Take 100 mcg by mouth daily before breakfast.    [provider]  losartan (COZAAR) 100 MG tablet TAKE 1 TABLET BY MOUTH ONCE DAILY. OFFICE VISIT FOR REFILLS 08/12/18   Allred, Jeneen Rinks, MD  meloxicam (MOBIC) 15 MG tablet Take 15 mg by mouth daily.    [provider]  metFORMIN (GLUCOPHAGE) 1000 MG tablet Take 1,000 mg by mouth 2 (two) times daily. 11/20/16   [provider]  metoprolol tartrate (LOPRESSOR) 50 MG tablet Take 1 tablet (50 mg total) by mouth 2 (two) times daily. Please make overdue appt with Dr. Rayann Heman before anymore refills. 1st attempt 11/22/17   Thompson Grayer, MD  terbinafine (LAMISIL) 250 MG tablet Take 250 mg by mouth daily.  11/17/16   [provider]  traMADol (ULTRAM) 50 MG tablet Take 50 mg by mouth daily as needed (pain).  11/23/16   [provider]  Vitamin D, Ergocalciferol, (DRISDOL) 50000 units CAPS capsule Take 50,000 Units by mouth 2 (two) times a week. Monday and Tuesday    [provider]    Family History Family History  Problem Relation Age of Onset   Heart attack Mother    Heart attack  Father    Heart failure Maternal Grandmother     Social History Social History   Tobacco Use   Smoking status: Never   Smokeless tobacco: Never  Vaping Use   Vaping Use: Never used  Substance Use Topics   Alcohol use: Yes    Comment: occ   Drug use: No     Allergies   Patient has no known allergies.   Review of Systems Review of Systems Per HPI  Physical Exam Triage Vital Signs ED Triage Vitals  Enc Vitals Group     BP 09/01/21 1638 (!) 181/131     Pulse Rate 09/01/21 1638 88     Resp 09/01/21 1638 16     Temp 09/01/21 1638 98.6 F (37 C)     Temp src  --      SpO2 09/01/21 1638 96 %     Weight --      Height --      Head Circumference --      Peak Flow --      Pain Score 09/01/21 1637 0     Pain Loc --      Pain Edu? --      Excl. in GC? --    No data found.  Updated Vital Signs BP (!) 220/132 (BP Location: Right Arm)   Pulse 88   Temp 98.6 F (37 C)   Resp 16   SpO2 96%   Visual Acuity Right Eye Distance:   Left Eye Distance:   Bilateral Distance:    Right Eye Near:   Left Eye Near:    Bilateral Near:     Physical Exam Constitutional:      General: He is not in acute distress.    Appearance: Normal appearance. He is not toxic-appearing or diaphoretic.  HENT:     Head: Normocephalic and atraumatic.  Eyes:     Extraocular Movements: Extraocular movements intact.     Conjunctiva/sclera: Conjunctivae normal.     Pupils: Pupils are equal, round, and reactive to light.  Cardiovascular:     Rate and Rhythm: Normal rate and regular rhythm.     Pulses: Normal pulses.     Heart sounds: Normal heart sounds.  Pulmonary:     Effort: Pulmonary effort is normal. No respiratory distress.     Breath sounds: Normal breath sounds.  Skin:    Comments: Patch of maculopapular rash present to left forearm.  Neurological:     General: No focal deficit present.     Mental Status: He is alert and oriented to person, place, and time. Mental status is at baseline.     Cranial Nerves: Cranial nerves 2-12 are intact.     Sensory: Sensation is intact.     Motor: Motor function is intact.     Coordination: Coordination is intact.     Gait: Gait is intact.  Psychiatric:        Mood and Affect: Mood normal.        Behavior: Behavior normal.        Thought Content: Thought content normal.        Judgment: Judgment normal.      UC Treatments / Results  Labs (all labs ordered are listed, but only abnormal results are displayed) Labs Reviewed - No data to display  EKG   Radiology No results found.  Procedures Procedures  (including critical care time)  Medications Ordered in UC Medications - No data to display  Initial Impression /  Assessment and Plan / UC Course  I have reviewed the triage vital signs and the nursing notes.  Pertinent labs & imaging results that were available during my care of the patient were reviewed by me and considered in my medical decision making (see chart for details).     Patient has significantly elevated blood pressure with retake being similar.  Patient also having associated generalized weakness.  Therefore, I do think this warrants further evaluation and management at the hospital.  Patient was advised that he will need to go to the hospital for further evaluation.  Suggested EMS transport but patient declined.  Risks associated with not going by EMS were discussed with patient.  Patient voiced understanding.  Patient wished to go to the hospital via his family member transporting him.  Will defer further evaluation and management of rash to ER as well given that patient is being sent emergently to the ER. Final Clinical Impressions(s) / UC Diagnoses   Final diagnoses:  Hypertensive emergency  Generalized weakness     Discharge Instructions      Go to the emergency department as soon as you leave urgent care for further evaluation and management.    ED Prescriptions   None    PDMP not reviewed this encounter.   Gustavus Bryant, [place] 09/01/21 1651

## 2021-09-01 NOTE — Discharge Instructions (Addendum)
You have been evaluated for your symptoms.  Your symptoms may be due to persistent elevated blood pressure.  Please continue taking blood pressure medication and also start taking hydrochlorothiazide and follow-up closely with your doctor in a week for further managements of your elevated blood pressure.  Also discussed with your doctor if you need a sleep study as lack of sleep can worsen your symptoms.  Return to the ER if you have any concern.

## 2021-09-01 NOTE — ED Notes (Signed)
All discharge instructions including follow up care and prescriptions reviewed with patient and patient's wife at bedside. Both verbalized understanding of same and had no other questions. Patient stable and ambulatory at time of discharge.

## 2021-09-01 NOTE — Discharge Instructions (Signed)
Go to the emergency department as soon as you leave urgent care for further evaluation and management. 

## 2021-09-01 NOTE — ED Triage Notes (Signed)
Pt reports to uc with co of weakness and concern for bp elevation. Pt reports recent rash on arm that the cream is not helping.

## 2021-09-01 NOTE — ED Provider Notes (Signed)
[facility] EMERGENCY DEPARTMENT Provider Note   CSN: [id number] Arrival date & time: 09/01/21  1740     History  Chief Complaint  Patient presents with   Hypertension    Don Hermann is a 56 y.o. male.  The history is provided by the patient and medical records. No language interpreter was used.  Hypertension    This is a 56 year old male significant history of non-insulin-dependent diabetes, hypercholesterolemia, obesity, hypertension, thyroid disease who was sent here from urgent care center for evaluation of generalized weakness.  Patient states for the past week he is having trouble sleeping.  He is having difficulty initiating sleep and for the past 2 to 3 days he feels more weak and tired than usual.  States he just feels a bit off.  He does not endorse any fever chills no vision changes no headache no nausea vomiting diarrhea no chest pain or shortness of breath no focal numbness or focal weakness no urinary symptoms he denies any recent medication changes.  He admits that he has been monitoring his diet adequately and have been eating quite a bit of sweets as well as fried chicken.  He is unsure if that may contribute to his symptoms.  He went to urgent care center today for evaluation but was noted to have elevated blood pressure for which she was sent here for further assessment.  He also noticed an itchy rash to his left forearm for the past week.  States he reached out to his PCP and was prescribed a steroid cream for which he used to apply to the rash with some improvement.  He denies any tick bite.  He does not endorse lightheadedness or dizziness.  Home Medications Prior to Admission medications   Medication Sig Start Date End Date Taking? Authorizing Provider  aspirin EC 81 MG tablet Take 81 mg by mouth once.    [provider]  empagliflozin (JARDIANCE) 10 MG TABS tablet Take 10 mg by mouth daily.    [provider]   hydrochlorothiazide (MICROZIDE) 12.5 MG capsule Take 1 capsule by mouth once daily 10/14/18   Allred, Fayrene Fearing, MD  levothyroxine (SYNTHROID, LEVOTHROID) 100 MCG tablet Take 100 mcg by mouth daily before breakfast.    [provider]  losartan (COZAAR) 100 MG tablet TAKE 1 TABLET BY MOUTH ONCE DAILY. OFFICE VISIT FOR REFILLS 08/12/18   Allred, Fayrene Fearing, MD  meloxicam (MOBIC) 15 MG tablet Take 15 mg by mouth daily.    [provider]  metFORMIN (GLUCOPHAGE) 1000 MG tablet Take 1,000 mg by mouth 2 (two) times daily. 11/20/16   [provider]  metoprolol tartrate (LOPRESSOR) 50 MG tablet Take 1 tablet (50 mg total) by mouth 2 (two) times daily. Please make overdue appt with Dr. Johney Frame before anymore refills. 1st attempt 11/22/17   Hillis Range, MD  terbinafine (LAMISIL) 250 MG tablet Take 250 mg by mouth daily.  11/17/16   [provider]  traMADol (ULTRAM) 50 MG tablet Take 50 mg by mouth daily as needed (pain).  11/23/16   [provider]  Vitamin D, Ergocalciferol, (DRISDOL) 50000 units CAPS capsule Take 50,000 Units by mouth 2 (two) times a week. Monday and Tuesday    [provider]      Allergies    Patient has no known allergies.    Review of Systems   Review of Systems  All other systems reviewed and are negative.   Physical Exam Updated Vital Signs BP (!) 234/131  Pulse 86   Temp 98.9 F (37.2 C) (Oral)   Resp 18   SpO2 98%  Physical Exam Vitals and nursing note reviewed.  Constitutional:      General: He is not in acute distress.    Appearance: He is well-developed. He is obese.  HENT:     Head: Atraumatic.     Mouth/Throat:     Mouth: Mucous membranes are moist.  Eyes:     Extraocular Movements: Extraocular movements intact.     Conjunctiva/sclera: Conjunctivae normal.     Pupils: Pupils are equal, round, and reactive to light.  Cardiovascular:     Rate and Rhythm: Normal rate and regular rhythm.     Pulses: Normal  pulses.     Heart sounds: Normal heart sounds.  Pulmonary:     Effort: Pulmonary effort is normal.     Breath sounds: Normal breath sounds. No wheezing, rhonchi or rales.  Abdominal:     Palpations: Abdomen is soft.     Tenderness: There is no abdominal tenderness.  Musculoskeletal:     Cervical back: Normal range of motion and neck supple. No rigidity.     Comments: 5/5 strength to all 4 extremities  Skin:    Findings: Rash (Left forearm: There is a faint maculopapular rash approximately 4 cm in diameter noted to mid forearm on the volar aspect without significant erythema or tenderness to palpation) present.  Neurological:     Mental Status: He is alert and oriented to person, place, and time.     GCS: GCS eye subscore is 4. GCS verbal subscore is 5. GCS motor subscore is 6.     Cranial Nerves: Cranial nerves 2-12 are intact.     Sensory: Sensation is intact.     Motor: Motor function is intact.  Psychiatric:        Mood and Affect: Mood normal.     ED Results / Procedures / Treatments   Labs (all labs ordered are listed, but only abnormal results are displayed) Labs Reviewed  CBC - Abnormal; Notable for the following components:      Result Value   RBC 6.03 (*)    MCH 25.2 (*)    RDW 15.9 (*)    All other components within normal limits  COMPREHENSIVE METABOLIC PANEL - Abnormal; Notable for the following components:   Glucose, Bld 122 (*)    Creatinine, Ser 1.45 (*)    Total Bilirubin 1.6 (*)    GFR, Estimated 57 (*)    All other components within normal limits  URINALYSIS, ROUTINE W REFLEX MICROSCOPIC - Abnormal; Notable for the following components:   APPearance HAZY (*)    Specific Gravity, Urine 1.031 (*)    Glucose, UA >=500 (*)    Ketones, ur 20 (*)    All other components within normal limits  TSH  TROPONIN I (HIGH SENSITIVITY)  TROPONIN I (HIGH SENSITIVITY)    EKG EKG Interpretation  Date/Time:  Thursday September 01 2021 19:01:53 EDT Ventricular Rate:   84 PR Interval:  177 QRS Duration: 118 QT Interval:  407 QTC Calculation: 482 R Axis:   -58 Text Interpretation: Sinus rhythm Probable left atrial enlargement Incomplete RBBB and LAFB No significant change since last tracing Confirmed by Richardean Canal [phone]) on 09/01/2021 8:08:13 PM  Radiology DG Chest 2 View  Result Date: 09/01/2021 CLINICAL DATA:  Hypertension EXAM: CHEST - 2 VIEW COMPARISON:  02/26/2018 FINDINGS: The heart size and mediastinal contours are within normal limits. Both  lungs are clear. The visualized skeletal structures are unremarkable. IMPRESSION: No active cardiopulmonary disease. Electronically Signed   By: Alcide Clever M.D.   On: 09/01/2021 20:57   CT Head Wo Contrast  Result Date: 09/01/2021 CLINICAL DATA:  Dizziness and hypertension, initial encounter EXAM: CT HEAD WITHOUT CONTRAST TECHNIQUE: Contiguous axial images were obtained from the base of the skull through the vertex without intravenous contrast. RADIATION DOSE REDUCTION: This exam was performed according to the departmental dose-optimization program which includes automated exposure control, adjustment of the mA and/or kV according to patient size and/or use of iterative reconstruction technique. COMPARISON:  None Available. FINDINGS: Brain: No evidence of acute infarction, hemorrhage, hydrocephalus, extra-axial collection or mass lesion/mass effect. Vascular: No hyperdense vessel or unexpected calcification. Skull: Normal. Negative for fracture or focal lesion. Sinuses/Orbits: No acute finding. Other: None. IMPRESSION: No acute intracranial abnormality noted. Electronically Signed   By: Alcide Clever M.D.   On: 09/01/2021 20:36    Procedures .Critical Care  Performed by: Fayrene Helper, PA-C Authorized by: Fayrene Helper, PA-C   Critical care provider statement:    Critical care time (minutes):  30   Critical care was time spent personally by me on the following activities:  Development of treatment plan with patient  or surrogate, discussions with consultants, evaluation of patient's response to treatment, examination of patient, ordering and review of laboratory studies, ordering and review of radiographic studies, ordering and performing treatments and interventions, pulse oximetry, re-evaluation of patient's condition and review of old charts     Medications Ordered in ED Medications  labetalol (NORMODYNE) injection 20 mg (20 mg Intravenous Given 09/01/21 2103)    ED Course/ Medical Decision Making/ A&P                           Medical Decision Making Amount and/or Complexity of Data Reviewed Labs: ordered. Radiology: ordered.  Risk Prescription drug management.   BP (!) 179/110   Pulse 88   Temp 98.2 F (36.8 C) (Oral)   Resp 18   SpO2 97%   7:04 PM This is a 56 year old male significant history of thyroid disease, hypertension, hyperlipidemia, obesity, diabetes presenting for evaluation of not feeling well.  Patient states for the past week he is having trouble sleeping especially trying to initiate sleep.  For the past 2 days he feels a bit off.  He denies having any active pain but states he feels a bit sluggish.  Does not endorse any headache vision changes cold symptoms chest pain shortness of breath abdominal pain focal numbness focal weakness.  He admits that his diet have not been appropriate as he has consumed quite a bit of sweets, as well as having eating fried food.  Otherwise he has been compliant with his medication.  Initially went to urgent care center for his complaint but was noted to be hypertensive with blood pressure in the 190 systolic.  Patient sent here for further assessment.  On exam this is a well-appearing male appears to be in no acute discomfort.  Heart lung sounds normal.  He does not have any focal neurodeficit on exam.  He has a faint maculopapular rash noted to his left forearm that does not appear to be infectious or allergic.  He does not have any active pain.   Vital signs remarkable for blood pressure of 234/131 however on recheck it was 196 systolic.  He is afebrile, no hypoxia.  Work-up initiated.  9:42 PM  Due to such elevated blood pressure I am concerned for potential hypertensive emergency, labs EKG and imaging independently viewed interpreted by me and I agree with radiology interpretation.  Patient has normal thyroid function, urinalysis showing no evidence of urinary tract infection, normal WBC, normal H&H, normal troponin, creatinine is 1.45, improved from prior, chest x-ray without any active cardiopulmonary disease, head CT scan without any acute intracranial abnormality and EKG without concerning ischemic changes.  After receiving 20 milligrams of labetalol his blood pressure did improve to 177 systolic.  Patient is currently taking amlodipine and metoprolol.  He was previously on hydrochlorothiazide.  I discussed care with Dr. Silverio Lay, plan to resume his hydrochlorothiazide for better blood pressure control and have patient follow-up with his PCP for further management of his high blood pressure.  No evidence of endorgan damage or hypertensive emergency at this time.  11:10 PM Negative delta troponin, doubt ACS.  Blood pressure steadily improving.  Patient felt better.  Will discharge home with prescription for hydrochlorothiazide and recommend patient to follow-up outpatient for further management of his condition.  Return precaution given.  This patient presents to the ED for concern of not feeling well, this involves an extensive number of treatment options, and is a complaint that carries with it a high risk of complications and morbidity.  The differential diagnosis includes hypertensive urgency, stroke, electrolytes imbalance, HTN, hypoglycemia, thyroid disease, anemia, dehydration  Co morbidities that complicate the patient evaluation HTN  DM Additional history obtained:  Additional history obtained from wife External records from  outside source obtained and reviewed including EMR including prior labs and imaging  Lab Tests:  I Ordered, and personally interpreted labs.  The pertinent results include:  as above  Imaging Studies ordered:  I ordered imaging studies including head CT I independently visualized and interpreted imaging which showed no acute finding I agree with the radiologist interpretation  Cardiac Monitoring:  The patient was maintained on a cardiac monitor.  I personally viewed and interpreted the cardiac monitored which showed an underlying rhythm of: NSR  Medicines ordered and prescription drug management:  I ordered medication including labetalol  for HTN Reevaluation of the patient after these medicines showed that the patient improved I have reviewed the patients home medicines and have made adjustments as needed  Test Considered: as above  Critical Interventions: antihypertensive medication  Consultations Obtained:  I requested consultation with the attending DR. Silverio Lay,  and discussed lab and imaging findings as well as pertinent plan - they recommend: management of BP  Problem List / ED Course: HTN  fatigue  Reevaluation:  After the interventions noted above, I reevaluated the patient and found that they have :improved  Social Determinants of Health: none  Dispostion:  After consideration of the diagnostic results and the patients response to treatment, I feel that the patent would benefit from outpt f/u.         Final Clinical Impression(s) / ED Diagnoses Final diagnoses:  Hypertensive urgency    Rx / DC Orders ED Discharge Orders     None         Fayrene Helper, PA-C 09/01/21 2313    Charlynne Pander, MD 09/03/21 [phone]

## 2021-09-01 NOTE — ED Triage Notes (Signed)
Patient comes from [facility] due to being hypertensive. Per patient the BP was reading 220 systolic in the R arm. Pt presented initially to [facility] because he's been feeling weak and fatigued for 2 days now. Denies headache,n/v/d, SHOB, new chest pain.

## 2021-09-21 ENCOUNTER — Telehealth: Payer: Self-pay | Admitting: *Deleted

## 2021-09-21 ENCOUNTER — Encounter: Payer: Self-pay | Admitting: Cardiology

## 2021-09-21 ENCOUNTER — Ambulatory Visit: Payer: No Typology Code available for payment source | Admitting: Cardiology

## 2021-09-21 VITALS — BP 136/96 | HR 85 | Ht 74.0 in | Wt 310.0 lb

## 2021-09-21 DIAGNOSIS — I10 Essential (primary) hypertension: Secondary | ICD-10-CM | POA: Diagnosis not present

## 2021-09-21 DIAGNOSIS — I471 Supraventricular tachycardia: Secondary | ICD-10-CM | POA: Diagnosis not present

## 2021-09-21 MED ORDER — CARVEDILOL 25 MG PO TABS: 25.0000 mg | ORAL_TABLET | Freq: Two times a day (BID) | ORAL | 3 refills | Status: DC

## 2021-09-21 NOTE — Progress Notes (Signed)
Cardiolgoy CONSULT Note    Date:  09/21/2021   ID:  Walter Gonzales, DOB 08-14-1965, MRN [id number]  PCP:  Galvin Proffer, MD  Cardiologist:  Armanda Magic, MD   Chief Complaint  Patient presents with   New Patient (Initial Visit)    HTN     History of Present Illness:  Walter Gonzales is a 56 y.o. male who is being seen today for the evaluation of hypertension at the request of Angelina Pih, MD.  This is a 56 year old male with a history of diabetes mellitus, hyperlipidemia, hypertension, SVT, morbid obesity and hypothyroidism who was recently seen in the ER with hypertension.  Apparently was having some generalized weakness and on admission to the ER blood pressure was 181/131 mmHg.  He has been compliant with his medication.  He was initially seen in urgent care and then sent to the emergency room.  He was given IV labetalol 20 mg and HCTZ was resumed.  Troponin was negative x2.  His current medical regimen includes HCTZ 12.5 mg daily, losartan 100 mg daily, Lopressor 50 mg twice daily.  He is here today for followup and is doing well.  He denies any chest pain or pressure, SOB, DOE, PND, orthopnea, LE edema, dizziness or syncope. He occasionally feels a skipped heart beat.  He says that he does not salt his food much but does admit to some added salt.  He has never been evaluated for OSA.  He feels sleepy when he wakes up in the morning on occasion.  He has not had any am HA.  If he is home during the day he will take a daytime nap.  His wife tells him that he snores at night. He is compliant with his meds and is tolerating meds with no SE.    Past Medical History:  Diagnosis Date   Diabetes mellitus without complication (HCC)    Gout    Hypercholesterolemia    Hypertension    Morbid obesity (HCC)    SVT (supraventricular tachycardia) (HCC)    adenosine sensitive short RP SVT   Thyroid disease    Hypothyroidism    Past Surgical History:  Procedure Laterality Date   ACHILLES  TENDON REPAIR Right     Current Medications: Current Meds  Medication Sig   aspirin EC 81 MG tablet Take 81 mg by mouth once.   atorvastatin (LIPITOR) 40 MG tablet Take 1 tablet by mouth once daily for 30 days for 90 days   B Complex-C-Folic Acid (HM SUPER VITAMIN B COMPLEX/C PO) Take by mouth.   BLACK CURRANT SEED OIL PO Take by mouth.   carvedilol (COREG) 25 MG tablet Take 1 tablet (25 mg total) by mouth 2 (two) times daily.   Cinnamon 500 MG TABS Take 2,000 mg by mouth daily.   Dulaglutide (TRULICITY) 3 MG/0.5ML SOPN as directed Subcutaneous qweekly   empagliflozin (JARDIANCE) 10 MG TABS tablet Take 10 mg by mouth daily.   indomethacin (INDOCIN) 50 MG capsule 1 capsule with food or milk Orally Twice a day prn for 30 days   Lemborexant (DAYVIGO) 5 MG TABS Take by mouth.   levothyroxine (SYNTHROID, LEVOTHROID) 100 MCG tablet Take 100 mcg by mouth daily before breakfast.   linaclotide (LINZESS) 290 MCG CAPS capsule TAKE 1 CAPSULE BY MOUTH 30 MINUTES BEFORE THE FIRST MEAL OF THE DAY ON AN EMPTY STOMACH for 90 days   losartan-hydrochlorothiazide (HYZAAR) 100-12.5 MG tablet Take 1 tablet by mouth daily.   meloxicam (MOBIC) 15  MG tablet Take 15 mg by mouth daily.   metFORMIN (GLUCOPHAGE) 1000 MG tablet Take 1,000 mg by mouth 2 (two) times daily.   omeprazole (PRILOSEC) 40 MG capsule TAKE 1 CAPSULE BY MOUTH 30 MINUTES BEFORE MORNING MEAL ONCE DAILY   polyethylene glycol (MIRALAX / GLYCOLAX) 17 g packet Take 17 g by mouth daily as needed.   terbinafine (LAMISIL) 250 MG tablet Take 250 mg by mouth daily.    traMADol (ULTRAM) 50 MG tablet Take 50 mg by mouth daily as needed (pain).    Turmeric (QC TUMERIC COMPLEX) 500 MG CAPS Take by mouth.   Vitamin D, Ergocalciferol, (DRISDOL) 50000 units CAPS capsule Take 50,000 Units by mouth 2 (two) times a week. Monday and Tuesday   zinc gluconate 50 MG tablet Take 50 mg by mouth daily.   [DISCONTINUED] metoprolol tartrate (LOPRESSOR) 50 MG tablet Take 1  tablet (50 mg total) by mouth 2 (two) times daily. Please make overdue appt with Dr. Johney Frame before anymore refills. 1st attempt    Allergies:   Patient has no known allergies.   Social History   Socioeconomic History   Marital status: Married    Spouse name: Not on file   Number of children: Not on file   Years of education: Not on file   Highest education level: Not on file  Occupational History   Not on file  Tobacco Use   Smoking status: Never   Smokeless tobacco: Never  Vaping Use   Vaping Use: Never used  Substance and Sexual Activity   Alcohol use: Yes    Comment: occ   Drug use: No   Sexual activity: Yes  Other Topics Concern   Not on file  Social History Narrative   Lives in [place]    Mental health care worker   Social Determinants of Health   Financial Resource Strain: Not on file  Food Insecurity: Not on file  Transportation Needs: Not on file  Physical Activity: Not on file  Stress: Not on file  Social Connections: Not on file     Family History:  The patient's family history includes Heart attack in his father and mother; Heart failure in his maternal grandmother.   ROS:   Please see the history of present illness.    ROS All other systems reviewed and are negative.      No data to display             PHYSICAL EXAM:   VS:  BP (!) 136/96   Pulse 85   Ht 6\' 2"  (1.88 m)   Wt (!) 310 lb (140.6 kg)   SpO2 98%   BMI 39.80 kg/m    GEN: Well nourished, well developed, in no acute distress  HEENT: normal  Neck: no JVD, carotid bruits, or masses Cardiac: RRR; no murmurs, rubs, or gallops,no edema.  Intact distal pulses bilaterally.  Respiratory:  clear to auscultation bilaterally, normal work of breathing GI: soft, nontender, nondistended, + BS MS: no deformity or atrophy  Skin: warm and dry, no rash Neuro:  Alert and Oriented x 3, Strength and sensation are intact Psych: euthymic mood, full affect  Wt Readings from Last 3 Encounters:   09/21/21 (!) 310 lb (140.6 kg)  12/28/18 (!) 347 lb (157.4 kg)  03/14/17 (!) 360 lb (163.3 kg)      Studies/Labs Reviewed:   ER records from 08/2021  Recent Labs: 09/01/2021: ALT 32; BUN 17; Creatinine, Ser 1.45; Hemoglobin 15.2; Platelets 219; Potassium 3.7;  Sodium 139; TSH 2.171    HYPERTENSION CONTROL Vitals:   09/21/21 1001 09/21/21 1039  BP: (!) 148/102 (!) 136/96    The patient's blood pressure is elevated above target today.  In order to address the patient's elevated BP: A current anti-hypertensive medication was adjusted today.; A new medication was prescribed today.; A referral to the [facility] will be placed.     Additional studies/ records that were reviewed today include:  EKG and labs from ER 09/01/2021    ASSESSMENT:    1. Essential hypertension   2. SVT (supraventricular tachycardia) (HCC)      PLAN:  In order of problems listed above:  Hypertension -BP remains elevated but improved -stop Lopressor and start Carvedilol 25mg  BID -continue HCTZ 12.5mg  daily and Losartan HCT 100-12.5mg  daily -HTN clinic in 1 week with Pharm D and if still elevated will change HCTZ to Chlorthalidone -check renal dopplers to rule out RAS -check 2D echo -will get HST to rule out OSA  SVT -he has not had any problems with SVT in years -stop Lopressor and change to carvedilol 25mg  BID for better BP control  Excessive Daytime Sleepiness -I suspect that he has OSA given his sx of daytime sleepiness as well as snoring -Undx OSA may be driving his HTN as well -I will get an Itamar HST to assess  Time Spent: 20 minutes total time of encounter, including 15 minutes spent in face-to-face patient care on the date of this encounter. This time includes coordination of care and counseling regarding above mentioned problem list. Remainder of non-face-to-face time involved reviewing chart documents/testing relevant to the patient encounter and documentation in the  medical record. I have independently reviewed documentation from referring provider  Medication Adjustments/Labs and Tests Ordered: Current medicines are reviewed at length with the patient today.  Concerns regarding medicines are outlined above.  Medication changes, Labs and Tests ordered today are listed in the Patient Instructions below.  Patient Instructions  Medication Instructions:  Your physician has recommended you make the following change in your medication:  1) STOP taking Lopressor (metoprolol tartrate) 2) START taking carvedilol 25 mg twice daily  *If you need a refill on your cardiac medications before your next appointment, please call your pharmacy*  Testing/Procedures: Your physician has requested that you have an echocardiogram. Echocardiography is a painless test that uses sound waves to create images of your heart. It provides your doctor with information about the size and shape of your heart and how well your heart's chambers and valves are working. This procedure takes approximately one hour. There are no restrictions for this procedure.  Your physician has recommended that you have a sleep study. This test records several body functions during sleep, including: brain activity, eye movement, oxygen and carbon dioxide blood levels, heart rate and rhythm, breathing rate and rhythm, the flow of air through your mouth and nose, snoring, body muscle movements, and chest and belly movement.  Your physician has requested that you have a renal artery duplex. During this test, an ultrasound is used to evaluate blood flow to the kidneys. Allow one hour for this exam. Do not eat after midnight the day before and avoid carbonated beverages. Take your medications as you usually do.  Follow-Up: At [facility], you and your health needs are our priority.  As part of our continuing mission to provide you with exceptional heart care, we have created designated Provider Care Teams.  These  Care Teams include your primary Cardiologist (  physician) and Advanced Practice Providers (APPs -  Physician Assistants and Nurse Practitioners) who all work together to provide you with the care you need, when you need it.  Your next appointment:     The format for your next appointment:   In Person  Provider:   Armanda Magic, MD   Other Instructions You have been referred to see PharmD in the Hypertension Clinic  Low-Sodium Eating Plan Sodium, which is an element that makes up salt, helps you maintain a healthy balance of fluids in your body. Too much sodium can increase your blood pressure and cause fluid and waste to be held in your body. Your health care provider or dietitian may recommend following this plan if you have high blood pressure (hypertension), kidney disease, liver disease, or heart failure. Eating less sodium can help lower your blood pressure, reduce swelling, and protect your heart, liver, and kidneys. What are tips for following this plan? Reading food labels The Nutrition Facts label lists the amount of sodium in one serving of the food. If you eat more than one serving, you must multiply the listed amount of sodium by the number of servings. Choose foods with less than 140 mg of sodium per serving. Avoid foods with 300 mg of sodium or more per serving. Shopping  Look for lower-sodium products, often labeled as "low-sodium" or "no salt added." Always check the sodium content, even if foods are labeled as "unsalted" or "no salt added." Buy fresh foods. Avoid canned foods and pre-made or frozen meals. Avoid canned, cured, or processed meats. Buy breads that have less than 80 mg of sodium per slice. Cooking  Eat more home-cooked food and less restaurant, buffet, and fast food. Avoid adding salt when cooking. Use salt-free seasonings or herbs instead of table salt or sea salt. Check with your health care provider or pharmacist before using salt substitutes. Cook  with plant-based oils, such as canola, sunflower, or olive oil. Meal planning When eating at a restaurant, ask that your food be prepared with less salt or no salt, if possible. Avoid dishes labeled as brined, pickled, cured, smoked, or made with soy sauce, miso, or teriyaki sauce. Avoid foods that contain MSG (monosodium glutamate). MSG is sometimes added to [place] food, bouillon, and some canned foods. Make meals that can be grilled, baked, poached, roasted, or steamed. These are generally made with less sodium. General information Most people on this plan should limit their sodium intake to 1,500-2,000 mg (milligrams) of sodium each day. What foods should I eat? Fruits Fresh, frozen, or canned fruit. Fruit juice. Vegetables Fresh or frozen vegetables. "No salt added" canned vegetables. "No salt added" tomato sauce and paste. Low-sodium or reduced-sodium tomato and vegetable juice. Grains Low-sodium cereals, including oats, puffed wheat and rice, and shredded wheat. Low-sodium crackers. Unsalted rice. Unsalted pasta. Low-sodium bread. Whole-grain breads and whole-grain pasta. Meats and other proteins Fresh or frozen (no salt added) meat, poultry, seafood, and fish. Low-sodium canned tuna and salmon. Unsalted nuts. Dried peas, beans, and lentils without added salt. Unsalted canned beans. Eggs. Unsalted nut butters. Dairy Milk. Soy milk. Cheese that is naturally low in sodium, such as ricotta cheese, fresh mozzarella, or Swiss cheese. Low-sodium or reduced-sodium cheese. Cream cheese. Yogurt. Seasonings and condiments Fresh and dried herbs and spices. Salt-free seasonings. Low-sodium mustard and ketchup. Sodium-free salad dressing. Sodium-free light mayonnaise. Fresh or refrigerated horseradish. Lemon juice. Vinegar. Other foods Homemade, reduced-sodium, or low-sodium soups. Unsalted popcorn and pretzels. Low-salt or salt-free chips. The  items listed above may not be a complete list of foods  and beverages you can eat. Contact a dietitian for more information. What foods should I avoid? Vegetables Sauerkraut, pickled vegetables, and relishes. Olives. [place] fries. Onion rings. Regular canned vegetables (not low-sodium or reduced-sodium). Regular canned tomato sauce and paste (not low-sodium or reduced-sodium). Regular tomato and vegetable juice (not low-sodium or reduced-sodium). Frozen vegetables in sauces. Grains Instant hot cereals. Bread stuffing, pancake, and biscuit mixes. Croutons. Seasoned rice or pasta mixes. Noodle soup cups. Boxed or frozen macaroni and cheese. Regular salted crackers. Self-rising flour. Meats and other proteins Meat or fish that is salted, canned, smoked, spiced, or pickled. Precooked or cured meat, such as sausages or meat loaves. Tomasa Blase. Ham. Pepperoni. Hot dogs. Corned beef. Chipped beef. Salt pork. Jerky. Pickled herring. Anchovies and sardines. Regular canned tuna. Salted nuts. Dairy Processed cheese and cheese spreads. Hard cheeses. Cheese curds. Blue cheese. Feta cheese. String cheese. Regular cottage cheese. Buttermilk. Canned milk. Fats and oils Salted butter. Regular margarine. Ghee. Bacon fat. Seasonings and condiments Onion salt, garlic salt, seasoned salt, table salt, and sea salt. Canned and packaged gravies. Worcestershire sauce. Tartar sauce. Barbecue sauce. Teriyaki sauce. Soy sauce, including reduced-sodium. Steak sauce. Fish sauce. Oyster sauce. Cocktail sauce. Horseradish that you find on the shelf. Regular ketchup and mustard. Meat flavorings and tenderizers. Bouillon cubes. Hot sauce. Pre-made or packaged marinades. Pre-made or packaged taco seasonings. Relishes. Regular salad dressings. Salsa. Other foods Salted popcorn and pretzels. Corn chips and puffs. Potato and tortilla chips. Canned or dried soups. Pizza. Frozen entrees and pot pies. The items listed above may not be a complete list of foods and beverages you should avoid. Contact a  dietitian for more information. Summary Eating less sodium can help lower your blood pressure, reduce swelling, and protect your heart, liver, and kidneys. Most people on this plan should limit their sodium intake to 1,500-2,000 mg (milligrams) of sodium each day. Canned, boxed, and frozen foods are high in sodium. Restaurant foods, fast foods, and pizza are also very high in sodium. You also get sodium by adding salt to food. Try to cook at home, eat more fresh fruits and vegetables, and eat less fast food and canned, processed, or prepared foods. This information is not intended to replace advice given to you by your health care provider. Make sure you discuss any questions you have with your health care provider. Document Revised: 02/21/2019 Document Reviewed: 12/18/2018 Elsevier Patient Education  [address]    Signed, Armanda Magic, MD  09/21/2021 10:40 AM    [facility] [address] [place], [place], [place]  [address] Phone: [phone]; Fax: [phone]

## 2021-09-21 NOTE — Patient Instructions (Signed)
Medication Instructions:  Your physician has recommended you make the following change in your medication:  1) STOP taking Lopressor (metoprolol tartrate) 2) START taking carvedilol 25 mg twice daily  *If you need a refill on your cardiac medications before your next appointment, please call your pharmacy*  Testing/Procedures: Your physician has requested that you have an echocardiogram. Echocardiography is a painless test that uses sound waves to create images of your heart. It provides your doctor with information about the size and shape of your heart and how well your heart's chambers and valves are working. This procedure takes approximately one hour. There are no restrictions for this procedure.  Your physician has recommended that you have a sleep study. This test records several body functions during sleep, including: brain activity, eye movement, oxygen and carbon dioxide blood levels, heart rate and rhythm, breathing rate and rhythm, the flow of air through your mouth and nose, snoring, body muscle movements, and chest and belly movement.  Your physician has requested that you have a renal artery duplex. During this test, an ultrasound is used to evaluate blood flow to the kidneys. Allow one hour for this exam. Do not eat after midnight the day before and avoid carbonated beverages. Take your medications as you usually do.  Follow-Up: At [facility], you and your health needs are our priority.  As part of our continuing mission to provide you with exceptional heart care, we have created designated Provider Care Teams.  These Care Teams include your primary Cardiologist (physician) and Advanced Practice Providers (APPs -  Physician Assistants and Nurse Practitioners) who all work together to provide you with the care you need, when you need it.  Your next appointment:     The format for your next appointment:   In Person  Provider:   Armanda Magic, MD   Other Instructions You  have been referred to see PharmD in the Hypertension Clinic  Low-Sodium Eating Plan Sodium, which is an element that makes up salt, helps you maintain a healthy balance of fluids in your body. Too much sodium can increase your blood pressure and cause fluid and waste to be held in your body. Your health care provider or dietitian may recommend following this plan if you have high blood pressure (hypertension), kidney disease, liver disease, or heart failure. Eating less sodium can help lower your blood pressure, reduce swelling, and protect your heart, liver, and kidneys. What are tips for following this plan? Reading food labels The Nutrition Facts label lists the amount of sodium in one serving of the food. If you eat more than one serving, you must multiply the listed amount of sodium by the number of servings. Choose foods with less than 140 mg of sodium per serving. Avoid foods with 300 mg of sodium or more per serving. Shopping  Look for lower-sodium products, often labeled as "low-sodium" or "no salt added." Always check the sodium content, even if foods are labeled as "unsalted" or "no salt added." Buy fresh foods. Avoid canned foods and pre-made or frozen meals. Avoid canned, cured, or processed meats. Buy breads that have less than 80 mg of sodium per slice. Cooking  Eat more home-cooked food and less restaurant, buffet, and fast food. Avoid adding salt when cooking. Use salt-free seasonings or herbs instead of table salt or sea salt. Check with your health care provider or pharmacist before using salt substitutes. Cook with plant-based oils, such as canola, sunflower, or olive oil. Meal planning When eating at  a restaurant, ask that your food be prepared with less salt or no salt, if possible. Avoid dishes labeled as brined, pickled, cured, smoked, or made with soy sauce, miso, or teriyaki sauce. Avoid foods that contain MSG (monosodium glutamate). MSG is sometimes added to  [place] food, bouillon, and some canned foods. Make meals that can be grilled, baked, poached, roasted, or steamed. These are generally made with less sodium. General information Most people on this plan should limit their sodium intake to 1,500-2,000 mg (milligrams) of sodium each day. What foods should I eat? Fruits Fresh, frozen, or canned fruit. Fruit juice. Vegetables Fresh or frozen vegetables. "No salt added" canned vegetables. "No salt added" tomato sauce and paste. Low-sodium or reduced-sodium tomato and vegetable juice. Grains Low-sodium cereals, including oats, puffed wheat and rice, and shredded wheat. Low-sodium crackers. Unsalted rice. Unsalted pasta. Low-sodium bread. Whole-grain breads and whole-grain pasta. Meats and other proteins Fresh or frozen (no salt added) meat, poultry, seafood, and fish. Low-sodium canned tuna and salmon. Unsalted nuts. Dried peas, beans, and lentils without added salt. Unsalted canned beans. Eggs. Unsalted nut butters. Dairy Milk. Soy milk. Cheese that is naturally low in sodium, such as ricotta cheese, fresh mozzarella, or Swiss cheese. Low-sodium or reduced-sodium cheese. Cream cheese. Yogurt. Seasonings and condiments Fresh and dried herbs and spices. Salt-free seasonings. Low-sodium mustard and ketchup. Sodium-free salad dressing. Sodium-free light mayonnaise. Fresh or refrigerated horseradish. Lemon juice. Vinegar. Other foods Homemade, reduced-sodium, or low-sodium soups. Unsalted popcorn and pretzels. Low-salt or salt-free chips. The items listed above may not be a complete list of foods and beverages you can eat. Contact a dietitian for more information. What foods should I avoid? Vegetables Sauerkraut, pickled vegetables, and relishes. Olives. [place] fries. Onion rings. Regular canned vegetables (not low-sodium or reduced-sodium). Regular canned tomato sauce and paste (not low-sodium or reduced-sodium). Regular tomato and vegetable juice (not  low-sodium or reduced-sodium). Frozen vegetables in sauces. Grains Instant hot cereals. Bread stuffing, pancake, and biscuit mixes. Croutons. Seasoned rice or pasta mixes. Noodle soup cups. Boxed or frozen macaroni and cheese. Regular salted crackers. Self-rising flour. Meats and other proteins Meat or fish that is salted, canned, smoked, spiced, or pickled. Precooked or cured meat, such as sausages or meat loaves. Tomasa Blase. Ham. Pepperoni. Hot dogs. Corned beef. Chipped beef. Salt pork. Jerky. Pickled herring. Anchovies and sardines. Regular canned tuna. Salted nuts. Dairy Processed cheese and cheese spreads. Hard cheeses. Cheese curds. Blue cheese. Feta cheese. String cheese. Regular cottage cheese. Buttermilk. Canned milk. Fats and oils Salted butter. Regular margarine. Ghee. Bacon fat. Seasonings and condiments Onion salt, garlic salt, seasoned salt, table salt, and sea salt. Canned and packaged gravies. Worcestershire sauce. Tartar sauce. Barbecue sauce. Teriyaki sauce. Soy sauce, including reduced-sodium. Steak sauce. Fish sauce. Oyster sauce. Cocktail sauce. Horseradish that you find on the shelf. Regular ketchup and mustard. Meat flavorings and tenderizers. Bouillon cubes. Hot sauce. Pre-made or packaged marinades. Pre-made or packaged taco seasonings. Relishes. Regular salad dressings. Salsa. Other foods Salted popcorn and pretzels. Corn chips and puffs. Potato and tortilla chips. Canned or dried soups. Pizza. Frozen entrees and pot pies. The items listed above may not be a complete list of foods and beverages you should avoid. Contact a dietitian for more information. Summary Eating less sodium can help lower your blood pressure, reduce swelling, and protect your heart, liver, and kidneys. Most people on this plan should limit their sodium intake to 1,500-2,000 mg (milligrams) of sodium each day. Canned, boxed, and frozen foods are  high in sodium. Restaurant foods, fast foods, and pizza are  also very high in sodium. You also get sodium by adding salt to food. Try to cook at home, eat more fresh fruits and vegetables, and eat less fast food and canned, processed, or prepared foods. This information is not intended to replace advice given to you by your health care provider. Make sure you discuss any questions you have with your health care provider. Document Revised: 02/21/2019 Document Reviewed: 12/18/2018 Elsevier Patient Education  2023 [employer].

## 2021-09-21 NOTE — Telephone Encounter (Signed)
Pt seen in the office today by Dr. Mayford Knife who ordered an Itamar study. Pt agreeable to signed waiver. Pt aware to not open the box until he has been called with the PIN #.

## 2021-09-27 ENCOUNTER — Encounter: Payer: Self-pay | Admitting: *Deleted

## 2021-09-27 DIAGNOSIS — R0683 Snoring: Secondary | ICD-10-CM

## 2021-09-29 NOTE — Progress Notes (Signed)
THIS WAS OPENED IN ERROR, PLEASE DISREGARD

## 2021-09-29 NOTE — Progress Notes (Signed)
123

## 2021-09-29 NOTE — Procedures (Signed)
error 

## 2021-09-29 NOTE — Progress Notes (Signed)
This encounter was created in error - please disregard.  This encounter was created in error - please disregard.

## 2021-10-18 NOTE — Telephone Encounter (Signed)
Pt was denied for Itamar study. I will call the pt to ask for the device to be returned by next week. Will let the pt know that Milas Hock. CMA will call about other type of home sleep study.   I left message for the pt to return Itamar device as he has been denied, see notes. Left message if device could please be returned this week or by the end of next week.

## 2021-10-18 NOTE — Telephone Encounter (Addendum)
Prior Authorization for [facility] sent to [facility] via Fax In lab sleep study denied. Provider/facility is not participating in the [employer] Can submitt for a Home Sleep study at In network providers/facilities at [facility] or [facility] [address] [place].

## 2021-10-19 ENCOUNTER — Telehealth: Payer: Self-pay | Admitting: Cardiology

## 2021-10-19 NOTE — Telephone Encounter (Signed)
Pt is calling back to get information on where to return the Itmur Device. Requesting call back.

## 2021-10-19 NOTE — Telephone Encounter (Signed)
Pt returned Itamar device today. I will un-register the device and place back in stock.

## 2021-10-20 NOTE — Telephone Encounter (Signed)
Walter Gonzales informed the patient where to bring the device back to.

## 2021-10-21 NOTE — Telephone Encounter (Signed)
See phones pt returned device on 10/19/21

## 2021-10-27 ENCOUNTER — Other Ambulatory Visit (HOSPITAL_COMMUNITY): Payer: No Typology Code available for payment source

## 2021-10-27 ENCOUNTER — Ambulatory Visit: Payer: No Typology Code available for payment source

## 2021-10-27 ENCOUNTER — Encounter (HOSPITAL_COMMUNITY): Payer: Self-pay | Admitting: Cardiology

## 2021-10-27 ENCOUNTER — Ambulatory Visit (HOSPITAL_COMMUNITY)
Admission: RE | Admit: 2021-10-27 | Payer: No Typology Code available for payment source | Source: Ambulatory Visit | Attending: Cardiology | Admitting: Cardiology

## 2021-11-01 ENCOUNTER — Encounter (HOSPITAL_COMMUNITY): Payer: Self-pay

## 2021-11-24 ENCOUNTER — Ambulatory Visit (HOSPITAL_COMMUNITY)
Admission: RE | Admit: 2021-11-24 | Discharge: 2021-11-24 | Disposition: A | Discharge status: Home / Self Care | Payer: No Typology Code available for payment source | Source: Ambulatory Visit | Attending: Cardiology | Admitting: Cardiology

## 2021-11-24 DIAGNOSIS — I471 Supraventricular tachycardia, unspecified: Secondary | ICD-10-CM

## 2021-11-24 DIAGNOSIS — I10 Essential (primary) hypertension: Secondary | ICD-10-CM | POA: Diagnosis present

## 2021-12-05 ENCOUNTER — Ambulatory Visit: Payer: No Typology Code available for payment source | Admitting: Cardiology

## 2021-12-05 NOTE — Progress Notes (Deleted)
Cardiolgoy CONSULT Note    Date:  12/05/2021   ID:  Walter Gonzales, DOB 02-Dec-1965, MRN [id number]  PCP:  Bonnita Nasuti, MD  Cardiologist:  Fransico Him, MD   No chief complaint on file.   History of Present Illness:  Walter Gonzales is a 56 y.o. male  with a history of diabetes mellitus, hyperlipidemia, hypertension, SVT, morbid obesity and hypothyroidism and recent diagnosis of hypertension.    He was hospitalized back in August 2023 and apparently was having some generalized weakness and on admission to the ER blood pressure was 181/131 mmHg.  He had been compliant with his medication.  He was initially seen in urgent care and then sent to the emergency room.  He was given IV labetalol 20 mg and HCTZ was resumed.  Troponin was negative x2.    When seen in the office his Lopressor was stopped and he was started on carvedilol 25 mg twice daily and continued on HCTZ 12.5 mg daily losartan HCT 100-12.5 mg daily.  Renal Dopplers were done which demonstrated no evidence of renal artery stenosis.  2D echo was ordered but not completed.  I home sleep study was also ordered but not completed.  He was supposed to follow-up in pharmacy hypertension clinic but did not show up.   Past Medical History:  Diagnosis Date   Diabetes mellitus without complication (HCC)    Gout    Hypercholesterolemia    Hypertension    Morbid obesity (HCC)    SVT (supraventricular tachycardia) (HCC)    adenosine sensitive short RP SVT   Thyroid disease    Hypothyroidism    Past Surgical History:  Procedure Laterality Date   ACHILLES TENDON REPAIR Right     Current Medications: No outpatient medications have been marked as taking for the 12/05/21 encounter (Appointment) with Sueanne Margarita, MD.    Allergies:   Patient has no known allergies.   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   ROS:   Please see the history of present illness.    ROS All other systems reviewed and are negative.      No data to display             PHYSICAL EXAM:   VS:  There were no vitals taken for this visit.   GEN: Well nourished, well developed, in no acute distress  HEENT: normal  Neck: no JVD, carotid  bruits, or masses Cardiac: RRR; no murmurs, rubs, or gallops,no edema.  Intact distal pulses bilaterally.  Respiratory:  clear to auscultation bilaterally, normal work of breathing GI: soft, nontender, nondistended, + BS MS: no deformity or atrophy  Skin: warm and dry, no rash Neuro:  Alert and Oriented x 3, Strength and sensation are intact Psych: euthymic mood, full affect  Wt Readings from Last 3 Encounters:  09/21/21 (!) 310 lb (140.6 kg)  12/28/18 (!) 347 lb (157.4 kg)  03/14/17 (!) 360 lb (163.3 kg)      Studies/Labs Reviewed:   ER records from 08/2021  Recent Labs: 09/01/2021: ALT 32; BUN 17; Creatinine, Ser 1.45; Hemoglobin 15.2; Platelets 219; Potassium 3.7; Sodium 139; TSH 2.171    No BP recorded.  {Refresh Note OR Click here to enter BP  :1}***   Additional studies/ records that were reviewed  today include:  EKG and labs from ER 09/01/2021    ASSESSMENT:    1. Essential hypertension   2. Snoring   3. SVT (supraventricular tachycardia)      PLAN:  In order of problems listed above:  Hypertension -BP remains elevated but improved -stop Lopressor and start Carvedilol 25mg  BID -continue HCTZ 12.5mg  daily and Losartan HCT 100-12.5mg  daily -HTN clinic in 1 week with Pharm D and if still elevated will change HCTZ to Chlorthalidone -check renal dopplers to rule out RAS -check 2D echo -will get HST to rule out OSA  SVT -he has not had any problems with SVT in years -stop Lopressor and change to carvedilol 25mg  BID for better BP control  Excessive Daytime Sleepiness -I suspect that he has OSA given his sx of daytime sleepiness as well as snoring -Undx OSA may be driving his HTN as well -I will get an Itamar HST to assess  Time Spent: 20 minutes total time of encounter, including 15 minutes spent in face-to-face patient care on the date of this encounter. This time includes coordination of care and counseling regarding above mentioned problem list. Remainder of non-face-to-face time involved reviewing chart documents/testing relevant to the patient encounter and documentation in the medical record. I have independently reviewed documentation from referring provider  Medication Adjustments/Labs and Tests Ordered: Current medicines are reviewed at length with the patient today.  Concerns regarding medicines are outlined above.  Medication changes, Labs and Tests ordered today are listed in the Patient Instructions below.  There are no Patient Instructions on file for this visit.    Signed, Fransico Him, MD  12/05/2021 9:10 AM    [facility] [place] [address], [place], [place]  [address] Phone: [phone]; Fax: [phone]

## 2021-12-18 NOTE — Progress Notes (Deleted)
Patient ID: Walter Gonzales                 DOB: 08-03-1965                      MRN: [id number]      HPI: Jefferie Holston is a 56 y.o. male referred by Dr. Mayford Knife to HTN clinic. PMH is significant for T2DM, CKD stage III, hypertension, morbid obesity.   Current HTN meds: carvedilol 25 mg twice daily, losartan 100-12.5 mg daily  Previously tried: hydrochlorothiazide 25 mg  BP goal: <130/80  Family History: Heart attack in his father and mother; Heart failure in his maternal grandmother.    Social History:   Diet:   Exercise:  {types:28256}  Home BP readings:  Date SBP/DBP  HR              Average      Wt Readings from Last 3 Encounters:  09/21/21 (!) 310 lb (140.6 kg)  12/28/18 (!) 347 lb (157.4 kg)  03/14/17 (!) 360 lb (163.3 kg)   BP Readings from Last 3 Encounters:  09/21/21 (!) 136/96  09/01/21 (!) 170/90  09/01/21 (!) 220/132   Pulse Readings from Last 3 Encounters:  09/21/21 85  09/01/21 84  09/01/21 88    Renal function: CrCl cannot be calculated (Patient's most recent lab result is older than the maximum 21 days allowed.).  Past Medical History:  Diagnosis Date   Diabetes mellitus without complication (HCC)    Gout    Hypercholesterolemia    Hypertension    Morbid obesity (HCC)    SVT (supraventricular tachycardia) (HCC)    adenosine sensitive short RP SVT   Thyroid disease    Hypothyroidism    Current Outpatient Medications on File Prior to Visit  Medication Sig Dispense Refill   aspirin EC 81 MG tablet Take 81 mg by mouth once.     atorvastatin (LIPITOR) 40 MG tablet Take 1 tablet by mouth once daily for 30 days for 90 days     B Complex-C-Folic Acid (HM SUPER VITAMIN B COMPLEX/C PO) Take by mouth.     BLACK CURRANT SEED OIL PO Take by mouth.     carvedilol (COREG) 25 MG tablet Take 1 tablet (25 mg total) by mouth 2 (two) times daily. 180 tablet 3   Cinnamon 500 MG TABS Take 2,000 mg by mouth daily.     Dulaglutide (TRULICITY) 3 MG/0.5ML SOPN  as directed Subcutaneous qweekly     empagliflozin (JARDIANCE) 10 MG TABS tablet Take 10 mg by mouth daily.     indomethacin (INDOCIN) 50 MG capsule 1 capsule with food or milk Orally Twice a day prn for 30 days     Lemborexant (DAYVIGO) 5 MG TABS Take by mouth.     levothyroxine (SYNTHROID, LEVOTHROID) 100 MCG tablet Take 100 mcg by mouth daily before breakfast.     linaclotide (LINZESS) 290 MCG CAPS capsule TAKE 1 CAPSULE BY MOUTH 30 MINUTES BEFORE THE FIRST MEAL OF THE DAY ON AN EMPTY STOMACH for 90 days     losartan-hydrochlorothiazide (HYZAAR) 100-12.5 MG tablet Take 1 tablet by mouth daily.     meloxicam (MOBIC) 15 MG tablet Take 15 mg by mouth daily.     metFORMIN (GLUCOPHAGE) 1000 MG tablet Take 1,000 mg by mouth 2 (two) times daily.     omeprazole (PRILOSEC) 40 MG capsule TAKE 1 CAPSULE BY MOUTH 30 MINUTES BEFORE MORNING MEAL ONCE DAILY  polyethylene glycol (MIRALAX / GLYCOLAX) 17 g packet Take 17 g by mouth daily as needed.     terbinafine (LAMISIL) 250 MG tablet Take 250 mg by mouth daily.      traMADol (ULTRAM) 50 MG tablet Take 50 mg by mouth daily as needed (pain).      Turmeric (QC TUMERIC COMPLEX) 500 MG CAPS Take by mouth.     Vitamin D, Ergocalciferol, (DRISDOL) 50000 units CAPS capsule Take 50,000 Units by mouth 2 (two) times a week. Monday and Tuesday     zinc gluconate 50 MG tablet Take 50 mg by mouth daily.     No current facility-administered medications on file prior to visit.    No Known Allergies  There were no vitals taken for this visit.   Assessment/Plan:  1. Hypertension -  No problem-specific Assessment & Plan notes found for this encounter.  @MTPCOMPLETEDLIST @  {f/u with PharmD:28259}  Thank you  , Pharm.D [facility] A Division of [facility] [address] [place], [place]  Phone: [phone]; Fax: [phone]

## 2021-12-19 ENCOUNTER — Ambulatory Visit: Payer: No Typology Code available for payment source | Attending: Student | Admitting: Student

## 2022-03-15 ENCOUNTER — Emergency Department (HOSPITAL_COMMUNITY)
Admission: EM | Admit: 2022-03-15 | Discharge: 2022-03-15 | Disposition: A | Discharge status: Home / Self Care | Payer: BC Managed Care – PPO | Attending: Emergency Medicine | Admitting: Emergency Medicine

## 2022-03-15 ENCOUNTER — Encounter (HOSPITAL_COMMUNITY): Payer: Self-pay | Admitting: Emergency Medicine

## 2022-03-15 ENCOUNTER — Other Ambulatory Visit: Payer: Self-pay

## 2022-03-15 ENCOUNTER — Emergency Department (HOSPITAL_COMMUNITY): Payer: BC Managed Care – PPO

## 2022-03-15 DIAGNOSIS — Z7982 Long term (current) use of aspirin: Secondary | ICD-10-CM | POA: Diagnosis not present

## 2022-03-15 DIAGNOSIS — R1013 Epigastric pain: Secondary | ICD-10-CM | POA: Diagnosis not present

## 2022-03-15 DIAGNOSIS — Z79899 Other long term (current) drug therapy: Secondary | ICD-10-CM | POA: Diagnosis not present

## 2022-03-15 DIAGNOSIS — I10 Essential (primary) hypertension: Secondary | ICD-10-CM | POA: Insufficient documentation

## 2022-03-15 DIAGNOSIS — R079 Chest pain, unspecified: Secondary | ICD-10-CM | POA: Diagnosis not present

## 2022-03-15 LAB — CBC
HCT: 45.4 % (ref 39.0–52.0)
Hemoglobin: 14.3 g/dL (ref 13.0–17.0)
MCH: 25.7 pg — ABNORMAL LOW (ref 26.0–34.0)
MCHC: 31.5 g/dL (ref 30.0–36.0)
MCV: 81.5 fL (ref 80.0–100.0)
Platelets: 226 10*3/uL (ref 150–400)
RBC: 5.57 MIL/uL (ref 4.22–5.81)
RDW: 15.4 % (ref 11.5–15.5)
WBC: 7.8 10*3/uL (ref 4.0–10.5)
nRBC: 0 % (ref 0.0–0.2)

## 2022-03-15 LAB — TROPONIN I (HIGH SENSITIVITY)
Troponin I (High Sensitivity): 6 ng/L (ref <18)
Troponin I (High Sensitivity): 7 ng/L (ref <18)

## 2022-03-15 LAB — HEPATIC FUNCTION PANEL
ALT: 28 U/L (ref 0–44)
AST: 28 U/L (ref 15–41)
Albumin: 4.3 g/dL (ref 3.5–5.0)
Alkaline Phosphatase: 76 U/L (ref 38–126)
Bilirubin, Direct: 0.2 mg/dL (ref 0.0–0.2)
Indirect Bilirubin: 1.1 mg/dL — ABNORMAL HIGH (ref 0.3–0.9)
Total Bilirubin: 1.3 mg/dL — ABNORMAL HIGH (ref 0.3–1.2)
Total Protein: 7.3 g/dL (ref 6.5–8.1)

## 2022-03-15 LAB — BASIC METABOLIC PANEL
Anion gap: 13 (ref 5–15)
BUN: 14 mg/dL (ref 6–20)
CO2: 23 mmol/L (ref 22–32)
Calcium: 9.6 mg/dL (ref 8.9–10.3)
Chloride: 100 mmol/L (ref 98–111)
Creatinine, Ser: 1.41 mg/dL — ABNORMAL HIGH (ref 0.61–1.24)
GFR, Estimated: 58 mL/min — ABNORMAL LOW (ref >60)
Glucose, Bld: 148 mg/dL — ABNORMAL HIGH (ref 70–99)
Potassium: 3.3 mmol/L — ABNORMAL LOW (ref 3.5–5.1)
Sodium: 136 mmol/L (ref 135–145)

## 2022-03-15 LAB — LIPASE, BLOOD: Lipase: 32 U/L (ref 11–51)

## 2022-03-15 MED ORDER — PANTOPRAZOLE SODIUM 20 MG PO TBEC: 20.0000 mg | DELAYED_RELEASE_TABLET | Freq: Every day | ORAL | 0 refills | Status: DC

## 2022-03-15 MED ORDER — ALUM & MAG HYDROXIDE-SIMETH 200-200-20 MG/5ML PO SUSP
30.0000 mL | Freq: Once | ORAL | Status: AC
  Administered 2022-03-15: 30 mL via ORAL
  Filled 2022-03-15: qty 30

## 2022-03-15 NOTE — ED Provider Notes (Signed)
[facility] Provider Note   CSN: [id number] Arrival date & time: 03/15/22  1051     History  Chief Complaint  Patient presents with   Chest Pain    Walter Gonzales is a 57 y.o. male.  Patient here with chest pain/upper abdominal pain.  Walter Gonzales feels like Walter Gonzales had indigestion since last night.  When Walter Gonzales burps Walter Gonzales feels a little bit better.  Has a history of high cholesterol and hypertension.  Denies any exertional chest pain no recent surgery or travel.  Nothing makes it worse or better.  Denies any nausea vomiting or diarrhea.  Walter Gonzales has had episodes like this in the past that always get better when Walter Gonzales burps but this 1 has been lasting longer than normal.  The history is provided by the patient.       Home Medications Prior to Admission medications   Medication Sig Start Date End Date Taking? Authorizing Provider  pantoprazole (PROTONIX) 20 MG tablet Take 1 tablet (20 mg total) by mouth daily for 14 days. 03/15/22 03/29/22 Yes Iviona Hole, DO  aspirin EC 81 MG tablet Take 81 mg by mouth once.    [provider]  atorvastatin (LIPITOR) 40 MG tablet Take 1 tablet by mouth once daily for 30 days for 90 days    [provider]  B Complex-C-Folic Acid (HM SUPER VITAMIN B COMPLEX/C PO) Take by mouth.    [provider]  BLACK CURRANT SEED OIL PO Take by mouth.    [provider]  carvedilol (COREG) 25 MG tablet Take 1 tablet (25 mg total) by mouth 2 (two) times daily. 09/21/21   Sueanne Margarita, MD  Cinnamon 500 MG TABS Take 2,000 mg by mouth daily.    [provider]  Dulaglutide (TRULICITY) 3 0000000 SOPN as directed Subcutaneous qweekly 04/20/21   [provider]  empagliflozin (JARDIANCE) 10 MG TABS tablet Take 10 mg by mouth daily.    [provider]  indomethacin (INDOCIN) 50 MG capsule 1 capsule with food or milk Orally Twice a day prn for 30 days    [provider]  Lemborexant  (DAYVIGO) 5 MG TABS Take by mouth.    [provider]  levothyroxine (SYNTHROID, LEVOTHROID) 100 MCG tablet Take 100 mcg by mouth daily before breakfast.    [provider]  linaclotide (LINZESS) 290 MCG CAPS capsule TAKE 1 CAPSULE BY MOUTH 30 MINUTES BEFORE THE FIRST MEAL OF THE DAY ON AN EMPTY STOMACH for 90 days    [provider]  losartan-hydrochlorothiazide (HYZAAR) 100-12.5 MG tablet Take 1 tablet by mouth daily. 08/30/21   [provider]  meloxicam (MOBIC) 15 MG tablet Take 15 mg by mouth daily.    [provider]  metFORMIN (GLUCOPHAGE) 1000 MG tablet Take 1,000 mg by mouth 2 (two) times daily. 11/20/16   [provider]  omeprazole (PRILOSEC) 40 MG capsule TAKE 1 CAPSULE BY MOUTH 30 MINUTES BEFORE MORNING MEAL ONCE DAILY    [provider]  polyethylene glycol (MIRALAX / GLYCOLAX) 17 g packet Take 17 g by mouth daily as needed.    [provider]  terbinafine (LAMISIL) 250 MG tablet Take 250 mg by mouth daily.  11/17/16   [provider]  traMADol (ULTRAM) 50 MG tablet Take 50 mg by mouth daily as needed (pain).  11/23/16   [provider]  Turmeric (QC TUMERIC COMPLEX) 500 MG CAPS Take by mouth.  [provider]  Vitamin D, Ergocalciferol, (DRISDOL) 50000 units CAPS capsule Take 50,000 Units by mouth 2 (two) times a week. Monday and Tuesday    [provider]  zinc gluconate 50 MG tablet Take 50 mg by mouth daily.    [provider]      Allergies    Patient has no known allergies.    Review of Systems   Review of Systems  Physical Exam Updated Vital Signs BP (!) 156/111   Pulse 92   Temp 98.8 F (37.1 C)   Resp 20   SpO2 98%  Physical Exam Vitals and nursing note reviewed.  Constitutional:      General: Walter Gonzales is not in acute distress.    Appearance: Walter Gonzales is well-developed.  HENT:     Head: Normocephalic and atraumatic.  Eyes:     Extraocular Movements:  Extraocular movements intact.     Conjunctiva/sclera: Conjunctivae normal.     Pupils: Pupils are equal, round, and reactive to light.  Cardiovascular:     Rate and Rhythm: Normal rate and regular rhythm.     Pulses:          Radial pulses are 2+ on the right side and 2+ on the left side.     Heart sounds: No murmur heard. Pulmonary:     Effort: Pulmonary effort is normal. No respiratory distress.     Breath sounds: Normal breath sounds.  Abdominal:     Palpations: Abdomen is soft.     Tenderness: There is no abdominal tenderness.  Musculoskeletal:        General: No swelling. Normal range of motion.     Cervical back: Normal range of motion and neck supple.     Right lower leg: No edema.     Left lower leg: No edema.  Skin:    General: Skin is warm and dry.     Capillary Refill: Capillary refill takes less than 2 seconds.  Neurological:     Mental Status: Walter Gonzales is alert.  Psychiatric:        Mood and Affect: Mood normal.     ED Results / Procedures / Treatments   Labs (all labs ordered are listed, but only abnormal results are displayed) Labs Reviewed  BASIC METABOLIC PANEL - Abnormal; Notable for the following components:      Result Value   Potassium 3.3 (*)    Glucose, Bld 148 (*)    Creatinine, Ser 1.41 (*)    GFR, Estimated 58 (*)    All other components within normal limits  CBC - Abnormal; Notable for the following components:   MCH 25.7 (*)    All other components within normal limits  HEPATIC FUNCTION PANEL - Abnormal; Notable for the following components:   Total Bilirubin 1.3 (*)    Indirect Bilirubin 1.1 (*)    All other components within normal limits  LIPASE, BLOOD  TROPONIN I (HIGH SENSITIVITY)  TROPONIN I (HIGH SENSITIVITY)    EKG EKG Interpretation  Date/Time:  Wednesday March 15 2022 11:05:48 EST Ventricular Rate:  92 PR Interval:  172 QRS Duration: 112 QT Interval:  390 QTC Calculation: 482 R Axis:   -63 Text Interpretation: Normal  sinus rhythm Confirmed by Lennice Sites ([id number]) on 03/15/2022 3:39:20 PM  Radiology DG Chest 2 View  Result Date: 03/15/2022 CLINICAL DATA:  Chest and epigastric pain since last night. EXAM: CHEST - 2 VIEW COMPARISON:  09/01/2021. FINDINGS: Trachea is midline. Heart size normal. Thoracic aorta  is somewhat uncoiled. Lungs are somewhat low in volume but clear. No pleural fluid. Degenerative changes in the spine. IMPRESSION: No acute findings. Electronically Signed   By: Lorin Picket M.D.   On: 03/15/2022 11:52    Procedures Procedures    Medications Ordered in ED Medications  alum & mag hydroxide-simeth (MAALOX/MYLANTA) 200-200-20 MG/5ML suspension 30 mL (30 mLs Oral Given 03/15/22 1543)    ED Course/ Medical Decision Making/ A&P                             Medical Decision Making Amount and/or Complexity of Data Reviewed Labs: ordered. Radiology: ordered.  Risk OTC drugs. Prescription drug management.   Smayan Berenson is here with chest pain/abdominal pain.  Differential diagnosis includes acid reflux versus less likely ACS.  Walter Gonzales has no risk factors for PE, Wells criteria 0 and doubt PE.  EKG shows sinus rhythm.  No ischemic changes.  I reviewed interpreted EKG.  Differential also includes but less likely to be pancreatitis or cholecystitis or pneumonia.  CBC, CMP, lipase, troponins ordered including chest x-ray.  Per my review and interpretation there is no significant anemia or electrolyte abnormality or kidney injury.  Lipase is normal.  Troponin negative x 2.  Gallbladder and liver enzymes within normal limits.  Feeling better after GI cocktail.  Checks x-ray per my review and interpretation shows no evidence of pneumonia or pneumothorax.  Overall atypical story for ACS despite risk factors.  I have talked with Dr. Burt Knack cardiology to help arrange for close follow-up to further workup cardiac etiology but I suspect that this is GI related.  Walter Gonzales understands close return precautions.   Will start him on Protonix and have him follow-up with GI as well.  Discharged in good condition.  This chart was dictated using voice recognition software.  Despite best efforts to proofread,  errors can occur which can change the documentation meaning.         Final Clinical Impression(s) / ED Diagnoses Final diagnoses:  Nonspecific chest pain  Epigastric pain    Rx / DC Orders ED Discharge Orders          Ordered    pantoprazole (PROTONIX) 20 MG tablet  Daily        03/15/22 1657              Lennice Sites, DO 03/15/22 1700

## 2022-03-15 NOTE — Discharge Instructions (Addendum)
Cardiology should be calling you with follow-up appointment.  I would start taking Protonix to help with any acid reflux.  Please return if symptoms worsen as discussed.  I have also given you a number to follow-up with gastroenterology as well.

## 2022-03-15 NOTE — ED Notes (Signed)
Lab called to add hepatic function and lipase.

## 2022-03-15 NOTE — ED Triage Notes (Signed)
Patient here with complaint of chest pain that started last night and persists today. Patient states he has been seen for same multiple times before but states it has previously not been cardiac related. Patient denies nausea, denies dizziness, denies shortness of breath. Patient is alert, oriented, speaking in complete sentences, and is in no apparent distress at this time.

## 2022-03-15 NOTE — ED Provider Triage Note (Signed)
Emergency Medicine Provider Triage Evaluation Note  Shonn Witteman , a 57 y.o. male  was evaluated in triage.  Pt complains of chest pain.  States he does have this pain for the past few months but it got worse last night.  He was unable to sleep due to the pain.  States it made him cough quite a bit last night.  States it feels like indigestion but has gotten worse so he wanted to come get checked out.  Denies shortness of breath.  Denies PPI use.  The pain is not exertional.  There is no radiation.  Pressing in the epigastric region makes the pain worse  Review of Systems  Positive: As above Negative: As above  Physical Exam  BP (!) 178/120   Pulse 97   Temp 98.4 F (36.9 C) (Oral)   Resp 20   SpO2 98%  Gen:   Awake, no distress   Resp:  Normal effort  MSK:   Moves extremities without difficulty  Other:    Medical Decision Making  Medically screening exam initiated at 11:50 AM.  Appropriate orders placed.  Tige Costilow was informed that the remainder of the evaluation will be completed by another provider, this initial triage assessment does not replace that evaluation, and the importance of remaining in the ED until their evaluation is complete.  ACS rule out initiated   Nehemiah Massed 03/15/22 1152

## 2022-03-21 ENCOUNTER — Encounter: Payer: Self-pay | Admitting: Nurse Practitioner

## 2022-03-22 NOTE — Progress Notes (Addendum)
Office Visit    Patient Name: Walter Gonzales Date of Encounter: 03/23/2022  PCP:  Bonnita Nasuti, MD   [facility]  Cardiologist:  Fransico Him, MD  Advanced Practice Provider:  No care team member to display Electrophysiologist:  None   HPI    Walter Gonzales is a 57 y.o. male with a past medical history of hypertension, diabetes mellitus, hypercholesteremia, SVT, morbid obesity, hypothyroidism and gout presents today for follow-up appointment.  He was last seen August 2023 and then recently been in the ER for hypertension.  Apparently he was having some generalized weakness send on admission to ER blood pressure was 181/131 mmHg.  He had been compliant with his medication.  Initially seen in urgent care and then sent to the emergency room.  Was given IV labetalol 20 mg and HCTZ was resumed.  Troponin was negative x 2.  Current medication regimen includes HCTZ 12.5 mg daily and losartan 100 mg daily, also Lopressor 50 mg twice a day.  When he was seen in follow-up in August he was doing well.  No chest pressure or shortness of breath.  No lower extremity edema, dizziness, syncope.  Occasionally felt a skipped beat.  Tried to avoid salt.  Never been evaluated for OSA.  Feels sleepy when he wakes up in the morning on occasion.  No headache.  He does require daytime nap if he is home during the day.  He does snore at night.  Compliant with medications.  Today, he tells me that he has pain at the center of his chest.  Nonexertional.  It did not necessarily happen after eating but did start in the middle of the night and continued to when he woke up.  He is a part of a bowling league and does not get home until late usually eating around 9 or 10 PM sometimes getting fast food on the way home.  He was started on a PPI.  He has had some weight loss.  Problems with constipation and dehydration.  Dr. Radford Pax ordered a sleep study but his insurance would not cover it.  I will send a  message to Sydell Axon to problem shoot.   Reports no shortness of breath nor dyspnea on exertion.  No edema, orthopnea, PND. Reports no palpitations.    Past Medical History    Past Medical History:  Diagnosis Date   Diabetes mellitus without complication (HCC)    Gout    Hypercholesterolemia    Hypertension    Morbid obesity (HCC)    SVT (supraventricular tachycardia)    adenosine sensitive short RP SVT   Thyroid disease    Hypothyroidism   Past Surgical History:  Procedure Laterality Date   ACHILLES TENDON REPAIR Right     Allergies  No Known Allergies  EKGs/Labs/Other Studies Reviewed:   The following studies were reviewed today: 10/2021 Renal US without renal artery stenosis  EKG:  EKG is not ordered today.   Recent Labs: 09/01/2021: TSH 2.171 03/15/2022: ALT 28; BUN 14; Creatinine, Ser 1.41; Hemoglobin 14.3; Platelets 226; Potassium 3.3; Sodium 136  Recent Lipid Panel    Component Value Date/Time   CHOL 187 10/05/2016 0003   TRIG 251 (H) 10/05/2016 0003   HDL 55 10/05/2016 0003   CHOLHDL 3.4 10/05/2016 0003   VLDL 50 (H) 10/05/2016 0003   LDLCALC 82 10/05/2016 0003   Home Medications   Current Meds  Medication Sig   aspirin EC 81 MG tablet Take 81  mg by mouth once.   atorvastatin (LIPITOR) 40 MG tablet Take 1 tablet by mouth once daily for 30 days for 90 days   B Complex-C-Folic Acid (HM SUPER VITAMIN B COMPLEX/C PO) Take by mouth.   BLACK CURRANT SEED OIL PO Take by mouth.   carvedilol (COREG) 25 MG tablet Take 1 tablet (25 mg total) by mouth 2 (two) times daily.   Cinnamon 500 MG TABS Take 2,000 mg by mouth daily.   Dulaglutide (TRULICITY) 3 0000000 SOPN as directed Subcutaneous qweekly   empagliflozin (JARDIANCE) 10 MG TABS tablet Take 10 mg by mouth daily.   indomethacin (INDOCIN) 50 MG capsule 1 capsule with food or milk Orally Twice a day prn for 30 days   Lemborexant (DAYVIGO) 5 MG TABS Take by mouth.   levothyroxine (SYNTHROID, LEVOTHROID)  100 MCG tablet Take 100 mcg by mouth daily before breakfast.   linaclotide (LINZESS) 290 MCG CAPS capsule TAKE 1 CAPSULE BY MOUTH 30 MINUTES BEFORE THE FIRST MEAL OF THE DAY ON AN EMPTY STOMACH for 90 days   meloxicam (MOBIC) 15 MG tablet Take 15 mg by mouth daily.   metFORMIN (GLUCOPHAGE) 1000 MG tablet Take 1,000 mg by mouth 2 (two) times daily.   olmesartan-hydrochlorothiazide (BENICAR HCT) 40-12.5 MG tablet Take 1 tablet by mouth daily.   omeprazole (PRILOSEC) 40 MG capsule TAKE 1 CAPSULE BY MOUTH 30 MINUTES BEFORE MORNING MEAL ONCE DAILY   pantoprazole (PROTONIX) 20 MG tablet Take 1 tablet (20 mg total) by mouth daily for 14 days.   polyethylene glycol (MIRALAX / GLYCOLAX) 17 g packet Take 17 g by mouth daily as needed.   terbinafine (LAMISIL) 250 MG tablet Take 250 mg by mouth daily.    traMADol (ULTRAM) 50 MG tablet Take 50 mg by mouth daily as needed (pain).    Turmeric (QC TUMERIC COMPLEX) 500 MG CAPS Take by mouth.   Vitamin D, Ergocalciferol, (DRISDOL) 50000 units CAPS capsule Take 50,000 Units by mouth 2 (two) times a week. Monday and Tuesday   zinc gluconate 50 MG tablet Take 50 mg by mouth daily.   [DISCONTINUED] losartan-hydrochlorothiazide (HYZAAR) 100-12.5 MG tablet Take 1 tablet by mouth daily.     Review of Systems      All other systems reviewed and are otherwise negative except as noted above.  Physical Exam    VS:  BP (!) 158/98   Pulse 93   Ht 6' 2"$  (1.88 m)   Wt 294 lb 6.4 oz (133.5 kg)   SpO2 99%   BMI 37.80 kg/m  , BMI Body mass index is 37.8 kg/m.  Wt Readings from Last 3 Encounters:  03/23/22 294 lb 6.4 oz (133.5 kg)  09/21/21 (!) 310 lb (140.6 kg)  12/28/18 (!) 347 lb (157.4 kg)     GEN: Well nourished, well developed, in no acute distress. HEENT: normal. Neck: Supple, no JVD, carotid bruits, or masses. Cardiac: RRR, no murmurs, rubs, or gallops. No clubbing, cyanosis, edema.  Radials/PT 2+ and equal bilaterally.  Respiratory:  Respirations  regular and unlabored, clear to auscultation bilaterally. GI: Soft, nontender, nondistended. MS: No deformity or atrophy. Skin: Warm and dry, no rash. Neuro:  Strength and sensation are intact. Psych: Normal affect.  Assessment & Plan    Hypertension -renal artery duplex negative -we switched his Hyzaar to Benicar for better BP control -asked him to track his HR and BP at home -borderline tachycardia on coreg 62m BID -continue low-sodium, heart healthy diet -2 week nursing visit  SVT -no repeat episodes at this time  OSA? -excessive daytime sleepiness and needing naps during the day -snoring -SVT, HTN, extreme fatigue -would recommend at home sleep study     Disposition: Follow up 3-4 with Fransico Him, MD or APP.  Signed, Elgie Collard, PA-C 03/23/2022, 10:36 PM [facility]

## 2022-03-23 ENCOUNTER — Encounter: Payer: Self-pay | Admitting: Physician Assistant

## 2022-03-23 ENCOUNTER — Ambulatory Visit: Payer: BC Managed Care – PPO | Attending: Physician Assistant | Admitting: Physician Assistant

## 2022-03-23 VITALS — BP 158/98 | HR 93 | Ht 74.0 in | Wt 294.4 lb

## 2022-03-23 DIAGNOSIS — I471 Supraventricular tachycardia, unspecified: Secondary | ICD-10-CM

## 2022-03-23 DIAGNOSIS — G4733 Obstructive sleep apnea (adult) (pediatric): Secondary | ICD-10-CM | POA: Diagnosis not present

## 2022-03-23 DIAGNOSIS — I10 Essential (primary) hypertension: Secondary | ICD-10-CM

## 2022-03-23 MED ORDER — OLMESARTAN MEDOXOMIL-HCTZ 40-12.5 MG PO TABS: 1.0000 | ORAL_TABLET | Freq: Every day | ORAL | 3 refills | Status: DC

## 2022-03-23 NOTE — Patient Instructions (Addendum)
Medication Instructions:  1.Stop Hyzaar 2.Start Benicar-Hctz 40-12.5 mg daily *If you need a refill on your cardiac medications before your next appointment, please call your pharmacy*   Lab Work: None If you have labs (blood work) drawn today and your tests are completely normal, you will receive your results only by: [place] (if you have MyChart) OR A paper copy in the mail If you have any lab test that is abnormal or we need to change your treatment, we will call you to review the results.   Follow-Up: At [facility], you and your health needs are our priority.  As part of our continuing mission to provide you with exceptional heart care, we have created designated Provider Care Teams.  These Care Teams include your primary Cardiologist (physician) and Advanced Practice Providers (APPs -  Physician Assistants and Nurse Practitioners) who all work together to provide you with the care you need, when you need it.   Your next appointment:   3-4 months or next available  Provider:   Fransico Him, MD    Schedule a 2 week nurse visit for a blood pressure check Other Instructions Aim for 64 oz or more of water daily [employer] is the brand of home blood pressure monitor that we recommend as discussed today Check your blood pressure daily, one hour after taking your morning medications for 2 weeks and keep a log to bring with you to your nurse visit

## 2022-04-03 ENCOUNTER — Ambulatory Visit
Admission: EM | Admit: 2022-04-03 | Discharge: 2022-04-03 | Disposition: A | Discharge status: Home / Self Care | Payer: BC Managed Care – PPO

## 2022-04-03 ENCOUNTER — Emergency Department (HOSPITAL_COMMUNITY)
Admission: EM | Admit: 2022-04-03 | Discharge: 2022-04-03 | Disposition: A | Discharge status: Home / Self Care | Payer: BC Managed Care – PPO | Attending: Emergency Medicine | Admitting: Emergency Medicine

## 2022-04-03 ENCOUNTER — Encounter (HOSPITAL_COMMUNITY): Payer: Self-pay

## 2022-04-03 ENCOUNTER — Emergency Department (HOSPITAL_COMMUNITY): Payer: BC Managed Care – PPO

## 2022-04-03 ENCOUNTER — Other Ambulatory Visit: Payer: Self-pay

## 2022-04-03 DIAGNOSIS — N179 Acute kidney failure, unspecified: Secondary | ICD-10-CM

## 2022-04-03 DIAGNOSIS — I10 Essential (primary) hypertension: Secondary | ICD-10-CM | POA: Diagnosis not present

## 2022-04-03 DIAGNOSIS — K5900 Constipation, unspecified: Secondary | ICD-10-CM | POA: Diagnosis not present

## 2022-04-03 DIAGNOSIS — Z7982 Long term (current) use of aspirin: Secondary | ICD-10-CM | POA: Insufficient documentation

## 2022-04-03 DIAGNOSIS — E119 Type 2 diabetes mellitus without complications: Secondary | ICD-10-CM | POA: Insufficient documentation

## 2022-04-03 DIAGNOSIS — R109 Unspecified abdominal pain: Secondary | ICD-10-CM | POA: Diagnosis present

## 2022-04-03 DIAGNOSIS — Z79899 Other long term (current) drug therapy: Secondary | ICD-10-CM | POA: Diagnosis not present

## 2022-04-03 DIAGNOSIS — Z7984 Long term (current) use of oral hypoglycemic drugs: Secondary | ICD-10-CM | POA: Insufficient documentation

## 2022-04-03 LAB — CBC WITH DIFFERENTIAL/PLATELET
Abs Immature Granulocytes: 0.02 10*3/uL (ref 0.00–0.07)
Basophils Absolute: 0 10*3/uL (ref 0.0–0.1)
Basophils Relative: 0 %
Eosinophils Absolute: 0.1 10*3/uL (ref 0.0–0.5)
Eosinophils Relative: 3 %
HCT: 44.9 % (ref 39.0–52.0)
Hemoglobin: 14.3 g/dL (ref 13.0–17.0)
Immature Granulocytes: 0 %
Lymphocytes Relative: 41 %
Lymphs Abs: 2.3 10*3/uL (ref 0.7–4.0)
MCH: 25.8 pg — ABNORMAL LOW (ref 26.0–34.0)
MCHC: 31.8 g/dL (ref 30.0–36.0)
MCV: 80.9 fL (ref 80.0–100.0)
Monocytes Absolute: 0.4 10*3/uL (ref 0.1–1.0)
Monocytes Relative: 8 %
Neutro Abs: 2.6 10*3/uL (ref 1.7–7.7)
Neutrophils Relative %: 48 %
Platelets: 210 10*3/uL (ref 150–400)
RBC: 5.55 MIL/uL (ref 4.22–5.81)
RDW: 15 % (ref 11.5–15.5)
WBC: 5.5 10*3/uL (ref 4.0–10.5)
nRBC: 0 % (ref 0.0–0.2)

## 2022-04-03 LAB — URINALYSIS, ROUTINE W REFLEX MICROSCOPIC
Bacteria, UA: NONE SEEN
Bilirubin Urine: NEGATIVE
Glucose, UA: >=500 mg/dL — AB
Hgb urine dipstick: NEGATIVE
Ketones, ur: 20 mg/dL — AB
Leukocytes,Ua: NEGATIVE
Nitrite: NEGATIVE
Protein, ur: NEGATIVE mg/dL
Specific Gravity, Urine: 1.029 (ref 1.005–1.030)
pH: 5 (ref 5.0–8.0)

## 2022-04-03 LAB — I-STAT CHEM 8, ED
BUN: 22 mg/dL — ABNORMAL HIGH (ref 6–20)
Calcium, Ion: 1.11 mmol/L — ABNORMAL LOW (ref 1.15–1.40)
Chloride: 102 mmol/L (ref 98–111)
Creatinine, Ser: 2.2 mg/dL — ABNORMAL HIGH (ref 0.61–1.24)
Glucose, Bld: 118 mg/dL — ABNORMAL HIGH (ref 70–99)
HCT: 45 % (ref 39.0–52.0)
Hemoglobin: 15.3 g/dL (ref 13.0–17.0)
Potassium: 3.7 mmol/L (ref 3.5–5.1)
Sodium: 138 mmol/L (ref 135–145)
TCO2: 25 mmol/L (ref 22–32)

## 2022-04-03 LAB — COMPREHENSIVE METABOLIC PANEL
ALT: 31 U/L (ref 0–44)
AST: 27 U/L (ref 15–41)
Albumin: 4.4 g/dL (ref 3.5–5.0)
Alkaline Phosphatase: 69 U/L (ref 38–126)
Anion gap: 14 (ref 5–15)
BUN: 18 mg/dL (ref 6–20)
CO2: 25 mmol/L (ref 22–32)
Calcium: 9.9 mg/dL (ref 8.9–10.3)
Chloride: 100 mmol/L (ref 98–111)
Creatinine, Ser: 1.99 mg/dL — ABNORMAL HIGH (ref 0.61–1.24)
GFR, Estimated: 38 mL/min — ABNORMAL LOW (ref >60)
Glucose, Bld: 120 mg/dL — ABNORMAL HIGH (ref 70–99)
Potassium: 3.6 mmol/L (ref 3.5–5.1)
Sodium: 139 mmol/L (ref 135–145)
Total Bilirubin: 1.4 mg/dL — ABNORMAL HIGH (ref 0.3–1.2)
Total Protein: 7.4 g/dL (ref 6.5–8.1)

## 2022-04-03 LAB — LIPASE, BLOOD: Lipase: 41 U/L (ref 11–51)

## 2022-04-03 MED ORDER — IOHEXOL 350 MG/ML SOLN
75.0000 mL | Freq: Once | INTRAVENOUS | Status: AC | PRN
  Administered 2022-04-03: 75 mL via INTRAVENOUS

## 2022-04-03 MED ORDER — SODIUM CHLORIDE 0.9 % IV BOLUS
1000.0000 mL | Freq: Once | INTRAVENOUS | Status: AC
  Administered 2022-04-03: 1000 mL via INTRAVENOUS

## 2022-04-03 NOTE — ED Triage Notes (Signed)
Pt presents with c/o constipation. Last normal BM 1 week ago.

## 2022-04-03 NOTE — ED Triage Notes (Signed)
Pt reports constipation, last BM 1 week ago, passing some gas. Some nausea. Sent from [facility] for further evaluation

## 2022-04-03 NOTE — ED Provider Notes (Signed)
[facility]    CSN: [id number] Arrival date & time: 04/03/22  1307      History   Chief Complaint Chief Complaint  Patient presents with   Constipation    HPI Walter Gonzales is a 57 y.o. male.   Patient presents with concerns for constipation as he has not had a bowel movement in about 1 week.  He reports that he is still passing flatulence.  Reports some mild abdominal discomfort.  Also reports nausea without vomiting.  Denies blood in stool.  Denies any associated fever.  Denies history of bowel obstruction.  Reports that he does have a history of constipation where he takes Linzess.  He has not taken it in "a while" but was taking it for multiple years.  He stopped taking it given that it was causing diarrhea.  He took 1 pill today with no improvement in bowels.   Constipation   Past Medical History:  Diagnosis Date   Diabetes mellitus without complication (HCC)    Gout    Hypercholesterolemia    Hypertension    Morbid obesity ([place])    SVT (supraventricular tachycardia)    adenosine sensitive short RP SVT   Thyroid disease    Hypothyroidism    Patient Active Problem List   Diagnosis Date Noted   SVT (supraventricular tachycardia) 10/04/2016   Elevated troponin 10/04/2016   Morbid obesity ([place]) 10/04/2016   Type 2 diabetes mellitus ([place]) 10/04/2016   Snoring 10/04/2016   Essential hypertension 10/04/2016   Tachycardia 10/04/2016   CKD (chronic kidney disease), stage III ([place]) 10/04/2016    Past Surgical History:  Procedure Laterality Date   ACHILLES TENDON REPAIR Right        Home Medications    Prior to Admission medications   Medication Sig Start Date End Date Taking? Authorizing Provider  aspirin EC 81 MG tablet Take 81 mg by mouth once.    [provider]  atorvastatin (LIPITOR) 40 MG tablet Take 1 tablet by mouth once daily for 30 days for 90 days    [provider]  B Complex-C-Folic Acid (HM SUPER VITAMIN B COMPLEX/C  PO) Take by mouth.    [provider]  BLACK CURRANT SEED OIL PO Take by mouth.    [provider]  carvedilol (COREG) 25 MG tablet Take 1 tablet (25 mg total) by mouth 2 (two) times daily. 09/21/21   Sueanne Margarita, MD  Cinnamon 500 MG TABS Take 2,000 mg by mouth daily.    [provider]  Dulaglutide (TRULICITY) 3 0000000 SOPN as directed Subcutaneous qweekly 04/20/21   [provider]  empagliflozin (JARDIANCE) 10 MG TABS tablet Take 10 mg by mouth daily.    [provider]  indomethacin (INDOCIN) 50 MG capsule 1 capsule with food or milk Orally Twice a day prn for 30 days    [provider]  Lemborexant (DAYVIGO) 5 MG TABS Take by mouth.    [provider]  levothyroxine (SYNTHROID, LEVOTHROID) 100 MCG tablet Take 100 mcg by mouth daily before breakfast.    [provider]  linaclotide (LINZESS) 290 MCG CAPS capsule TAKE 1 CAPSULE BY MOUTH 30 MINUTES BEFORE THE FIRST MEAL OF THE DAY ON AN EMPTY STOMACH for 90 days    [provider]  meloxicam (MOBIC) 15 MG tablet Take 15 mg by mouth daily.    [provider]  metFORMIN (GLUCOPHAGE) 1000 MG tablet Take 1,000 mg by mouth 2 (two) times daily. 11/20/16  [provider]  olmesartan-hydrochlorothiazide (BENICAR HCT) 40-12.5 MG tablet Take 1 tablet by mouth daily. 03/23/22   Elgie Collard, PA-C  omeprazole (PRILOSEC) 40 MG capsule TAKE 1 CAPSULE BY MOUTH 30 MINUTES BEFORE MORNING MEAL ONCE DAILY    [provider]  pantoprazole (PROTONIX) 20 MG tablet Take 1 tablet (20 mg total) by mouth daily for 14 days. 03/15/22 03/29/22  Curatolo, Adam, DO  polyethylene glycol (MIRALAX / GLYCOLAX) 17 g packet Take 17 g by mouth daily as needed.    [provider]  terbinafine (LAMISIL) 250 MG tablet Take 250 mg by mouth daily.  11/17/16   [provider]  traMADol (ULTRAM) 50 MG tablet Take 50 mg by mouth daily as needed (pain).  11/23/16    [provider]  Turmeric (QC TUMERIC COMPLEX) 500 MG CAPS Take by mouth.    [provider]  Vitamin D, Ergocalciferol, (DRISDOL) 50000 units CAPS capsule Take 50,000 Units by mouth 2 (two) times a week. Monday and Tuesday    [provider]  zinc gluconate 50 MG tablet Take 50 mg by mouth daily.    [provider]    Family History Family History  Problem Relation Age of Onset   Heart attack Mother    Heart attack Father    Heart failure Maternal Grandmother     Social History Social History   Tobacco Use   Smoking status: Never   Smokeless tobacco: Never  Vaping Use   Vaping Use: Never used  Substance Use Topics   Alcohol use: Yes    Comment: occ   Drug use: No     Allergies   Patient has no known allergies.   Review of Systems Review of Systems Per HPI  Physical Exam Triage Vital Signs ED Triage Vitals  Enc Vitals Group     BP 04/03/22 1347 (!) 140/98     Pulse Rate 04/03/22 1347 87     Resp 04/03/22 1347 14     Temp 04/03/22 1347 98.2 F (36.8 C)     Temp Source 04/03/22 1347 Oral     SpO2 04/03/22 1347 98 %     Weight --      Height --      Head Circumference --      Peak Flow --      Pain Score 04/03/22 1348 3     Pain Loc --      Pain Edu? --      Excl. in [place]? --    No data found.  Updated Vital Signs BP (!) 140/98 (BP Location: Left Arm)   Pulse 87   Temp 98.2 F (36.8 C) (Oral)   Resp 14   SpO2 98%   Visual Acuity Right Eye Distance:   Left Eye Distance:   Bilateral Distance:    Right Eye Near:   Left Eye Near:    Bilateral Near:     Physical Exam Constitutional:      General: He is not in acute distress.    Appearance: Normal appearance. He is not toxic-appearing or diaphoretic.  HENT:     Head: Normocephalic and atraumatic.  Eyes:     Extraocular Movements: Extraocular movements intact.     Conjunctiva/sclera: Conjunctivae normal.  Cardiovascular:     Rate and Rhythm: Normal rate and  regular rhythm.     Pulses: Normal pulses.     Heart sounds: Normal heart sounds.  Pulmonary:     Effort: Pulmonary  effort is normal. No respiratory distress.     Breath sounds: Normal breath sounds.  Abdominal:     General: Bowel sounds are normal. There is no distension.     Palpations: Abdomen is soft.     Tenderness: There is abdominal tenderness.     Comments: Patient reports mild discomfort with palpation in upper center abdomen.  Neurological:     General: No focal deficit present.     Mental Status: He is alert and oriented to person, place, and time. Mental status is at baseline.  Psychiatric:        Mood and Affect: Mood normal.        Behavior: Behavior normal.        Thought Content: Thought content normal.        Judgment: Judgment normal.      UC Treatments / Results  Labs (all labs ordered are listed, but only abnormal results are displayed) Labs Reviewed - No data to display  EKG   Radiology No results found.  Procedures Procedures (including critical care time)  Medications Ordered in UC Medications - No data to display  Initial Impression / Assessment and Plan / UC Course  I have reviewed the triage vital signs and the nursing notes.  Pertinent labs & imaging results that were available during my care of the patient were reviewed by me and considered in my medical decision making (see chart for details).     Do not have x-ray tech here in urgent care to evaluate abdominal x-ray to ensure no bowel obstruction is present.  Given duration of time since last bowel movement, I do think this warrants further evaluation and management at the ER given I do not have imaging capabilities here in urgent care at this time.  Advised patient to go to the ER for further evaluation and management.  He was agreeable with plan.  Vital signs stable at discharge.  Agree with patient self transport to the hospital. Final Clinical Impressions(s) / UC Diagnoses   Final  diagnoses:  Constipation, unspecified constipation type     Discharge Instructions      Please go to the ER as soon as you leave urgent care for further evaluation and management.     ED Prescriptions   None    PDMP not reviewed this encounter.   Teodora Medici, [place] 04/03/22 [phone]

## 2022-04-03 NOTE — ED Provider Notes (Signed)
[facility] Provider Note   CSN: [id number] Arrival date & time: 04/03/22  1432     History  Chief Complaint  Patient presents with   Abdominal Pain   Constipation    Rydan Torell is a 57 y.o. male.  Patient presents emergency room complaining of 1 week of constipation.  Patient was evaluated earlier today by an urgent care who sent him to the emergency department to rule out small bowel obstruction.  The patient does endorse taking a laxative earlier today and having a small bowel movement.  He states he has passed a lot of gas throughout the week.  He has had little appetite.  He denies any nausea or vomiting.  He has not been drinking many fluids over the past week.  The patient recently had his Trulicity increased in dosage which seemed to create some GI complaints, this was discontinued approximately 2 weeks ago due to GI concerns.  Patient currently denies chest pain, shortness of breath, urinary symptoms, headache, fevers.  Past medical history significant for type II DM, obesity, hypertension, SVT, thyroid disease  HPI     Home Medications Prior to Admission medications   Medication Sig Start Date End Date Taking? Authorizing Provider  aspirin EC 81 MG tablet Take 81 mg by mouth once.    [provider]  atorvastatin (LIPITOR) 40 MG tablet Take 1 tablet by mouth once daily for 30 days for 90 days    [provider]  B Complex-C-Folic Acid (HM SUPER VITAMIN B COMPLEX/C PO) Take by mouth.    [provider]  BLACK CURRANT SEED OIL PO Take by mouth.    [provider]  carvedilol (COREG) 25 MG tablet Take 1 tablet (25 mg total) by mouth 2 (two) times daily. 09/21/21   Sueanne Margarita, MD  Cinnamon 500 MG TABS Take 2,000 mg by mouth daily.    [provider]  Dulaglutide (TRULICITY) 3 0000000 SOPN as directed Subcutaneous qweekly 04/20/21   [provider]  empagliflozin (JARDIANCE) 10  MG TABS tablet Take 10 mg by mouth daily.    [provider]  indomethacin (INDOCIN) 50 MG capsule 1 capsule with food or milk Orally Twice a day prn for 30 days    [provider]  Lemborexant (DAYVIGO) 5 MG TABS Take by mouth.    [provider]  levothyroxine (SYNTHROID, LEVOTHROID) 100 MCG tablet Take 100 mcg by mouth daily before breakfast.    [provider]  linaclotide (LINZESS) 290 MCG CAPS capsule TAKE 1 CAPSULE BY MOUTH 30 MINUTES BEFORE THE FIRST MEAL OF THE DAY ON AN EMPTY STOMACH for 90 days    [provider]  meloxicam (MOBIC) 15 MG tablet Take 15 mg by mouth daily.    [provider]  metFORMIN (GLUCOPHAGE) 1000 MG tablet Take 1,000 mg by mouth 2 (two) times daily. 11/20/16   [provider]  olmesartan-hydrochlorothiazide (BENICAR HCT) 40-12.5 MG tablet Take 1 tablet by mouth daily. 03/23/22   Elgie Collard, PA-C  omeprazole (PRILOSEC) 40 MG capsule TAKE 1 CAPSULE BY MOUTH 30 MINUTES BEFORE MORNING MEAL ONCE DAILY    [provider]  pantoprazole (PROTONIX) 20 MG tablet Take 1 tablet (20 mg total) by mouth daily for 14 days. 03/15/22 03/29/22  Curatolo, Adam, DO  polyethylene glycol (MIRALAX / GLYCOLAX) 17 g packet Take 17 g by mouth daily as needed.    [provider]  terbinafine (LAMISIL)  250 MG tablet Take 250 mg by mouth daily.  11/17/16   [provider]  traMADol (ULTRAM) 50 MG tablet Take 50 mg by mouth daily as needed (pain).  11/23/16   [provider]  Turmeric (QC TUMERIC COMPLEX) 500 MG CAPS Take by mouth.    [provider]  Vitamin D, Ergocalciferol, (DRISDOL) 50000 units CAPS capsule Take 50,000 Units by mouth 2 (two) times a week. Monday and Tuesday    [provider]  zinc gluconate 50 MG tablet Take 50 mg by mouth daily.    [provider]      Allergies    Patient has no known allergies.    Review of Systems   Review of Systems   Gastrointestinal:  Positive for constipation.    Physical Exam Updated Vital Signs BP (!) 147/96   Pulse 72   Temp 98.2 F (36.8 C) (Oral)   Resp 15   Ht '6\' 3"'$  (1.905 m)   Wt 132.5 kg   SpO2 100%   BMI 36.50 kg/m  Physical Exam Vitals and nursing note reviewed.  Constitutional:      General: He is not in acute distress.    Appearance: He is well-developed.  HENT:     Head: Normocephalic and atraumatic.  Eyes:     Conjunctiva/sclera: Conjunctivae normal.  Cardiovascular:     Rate and Rhythm: Normal rate and regular rhythm.     Heart sounds: No murmur heard. Pulmonary:     Effort: Pulmonary effort is normal. No respiratory distress.     Breath sounds: Normal breath sounds.  Abdominal:     Palpations: Abdomen is soft.     Tenderness: There is no abdominal tenderness.  Musculoskeletal:        General: No swelling.     Cervical back: Neck supple.  Skin:    General: Skin is warm and dry.     Capillary Refill: Capillary refill takes less than 2 seconds.  Neurological:     Mental Status: He is alert.  Psychiatric:        Mood and Affect: Mood normal.     ED Results / Procedures / Treatments   Labs (all labs ordered are listed, but only abnormal results are displayed) Labs Reviewed  COMPREHENSIVE METABOLIC PANEL - Abnormal; Notable for the following components:      Result Value   Glucose, Bld 120 (*)    Creatinine, Ser 1.99 (*)    Total Bilirubin 1.4 (*)    GFR, Estimated 38 (*)    All other components within normal limits  CBC WITH DIFFERENTIAL/PLATELET - Abnormal; Notable for the following components:   MCH 25.8 (*)    All other components within normal limits  URINALYSIS, ROUTINE W REFLEX MICROSCOPIC - Abnormal; Notable for the following components:   APPearance HAZY (*)    Glucose, UA >=500 (*)    Ketones, ur 20 (*)    All other components within normal limits  I-STAT CHEM 8, ED - Abnormal; Notable for the following components:   BUN 22 (*)     Creatinine, Ser 2.20 (*)    Glucose, Bld 118 (*)    Calcium, Ion 1.11 (*)    All other components within normal limits  LIPASE, BLOOD    EKG None  Radiology CT ABDOMEN PELVIS W CONTRAST  Result Date: 04/03/2022 CLINICAL DATA:  Abdominal pain, constipation EXAM: CT ABDOMEN AND PELVIS WITH CONTRAST TECHNIQUE: Multidetector CT imaging of the abdomen and pelvis was performed  using the standard protocol following bolus administration of intravenous contrast. RADIATION DOSE REDUCTION: This exam was performed according to the departmental dose-optimization program which includes automated exposure control, adjustment of the mA and/or kV according to patient size and/or use of iterative reconstruction technique. CONTRAST:  66m OMNIPAQUE IOHEXOL 350 MG/ML SOLN COMPARISON:  None Available. FINDINGS: Lower chest: Lung bases are clear. Hepatobiliary: Liver is within normal limits. Gallbladder is unremarkable. No intrahepatic or extrahepatic ductal dilatation. Pancreas: Within normal limits. Spleen: Within normal limits. Adrenals/Urinary Tract: Adrenal glands are within normal limits. Punctate nonobstructing right lower pole renal calculus (coronal image 102). Punctate nonobstructing interpolar left renal calculus (coronal image 109). Kidneys are otherwise within normal limits. No hydronephrosis. Bladder is within normal limits. Stomach/Bowel: Stomach is within normal limits. No evidence of bowel obstruction. Normal appendix (series 3/image 88). Normal colonic stool burden. No colonic wall thickening or inflammatory changes. Vascular/Lymphatic: No evidence of abdominal aortic aneurysm. Atherosclerotic calcifications of the abdominal aorta and branch vessels. No suspicious abdominopelvic lymphadenopathy. Reproductive: Prostate is unremarkable. Other: No abdominopelvic ascites. Musculoskeletal: Degenerative changes of the visualized thoracolumbar spine. IMPRESSION: Punctate nonobstructing bilateral renal calculi. No  hydronephrosis. No CT findings to account for the patient's abdominal pain. Electronically Signed   By: SJulian HyM.D.   On: 04/03/2022 18:54    Procedures Procedures    Medications Ordered in ED Medications  sodium chloride 0.9 % bolus 1,000 mL (0 mLs Intravenous Stopped 04/03/22 1654)  iohexol (OMNIPAQUE) 350 MG/ML injection 75 mL (75 mLs Intravenous Contrast Given 04/03/22 1829)    ED Course/ Medical Decision Making/ A&P                             Medical Decision Making  This patient presents to the ED for concern of constipation, this involves an extensive number of treatment options, and is a complaint that carries with it a high risk of complications and morbidity.  The differential diagnosis includes SBO, constipation, partial bowel obstruction, others   Co morbidities that complicate the patient evaluation  Type II DM, hypertension, obesity   Additional history obtained:  Additional history obtained from family at bedside External records from outside source obtained and reviewed including urgent care notes from earlier today   Lab Tests:  I Ordered, and personally interpreted labs.  The pertinent results include: Creatinine 1.99 (baseline around 1.4-1.5), grossly unremarkable CBC, lipase, UA   Imaging Studies ordered:  I ordered imaging studies including CT abdomen pelvis with contrast I independently visualized and interpreted imaging which showed Punctate nonobstructing bilateral renal calculi. No hydronephrosis.  No signs of obstruction. I agree with the radiologist interpretation   Problem List / ED Course / Critical interventions / Medication management   I ordered medication including normal saline bolus for fluid resuscitation (AKI, possible dehydration) Reevaluation of the patient after these medicines showed that the patient improved I have reviewed the patients home medicines and have made adjustments as needed   Test / Admission -  Considered:  Patient does have a mild AKI..Marland Kitchen Upon further discussion with the patient he has not been consuming much water.  I feel this is likely dehydration related.  He was given a bag of fluid while here in the emergency department and I have recommended that he follow-up with his primary care provider in 1 week for repeat metabolic panel.  There was no small bowel obstruction on imaging today.  The patient did have a small  bowel movement today.  He may have mild constipation.  He also endorses decreased oral intake with plans for outpatient GI follow-up in 2 weeks.  Patient will keep GI appointment as scheduled.  I see no indication for further emergent workup or admission at this time.  No life-threatening abdominal etiology noted at this time.  Discharge home.        Final Clinical Impression(s) / ED Diagnoses Final diagnoses:  Constipation, unspecified constipation type  AKI (acute kidney injury) [facility])    Rx / DC Orders ED Discharge Orders     None         Ronny Bacon 04/03/22 1917    Elgie Congo, MD 04/03/22 2253

## 2022-04-03 NOTE — Discharge Instructions (Addendum)
Your workup today was reassuring for no signs of small bowel obstruction.  Please be sure to consume plenty of fluids over the next week and have your labs rechecked in approximately 1 week to recheck your kidney function.  Please follow-up with your primary care and gastroenterologist to discuss your recent decrease in appetite and constipation issues.  Please discuss continuation of your IBS medications with your primary care provider.  If you develop any life-threatening symptoms please return to the emergency department.

## 2022-04-03 NOTE — ED Notes (Signed)
Updated pt on plan of care no questions at this time

## 2022-04-03 NOTE — ED Notes (Signed)
Pt reports epigastric pain that is pressure and sharp in nature states that he was here recently for same but had cardiac work up in which he did follow up and everything checked out normal pt does reports n/v denies fever

## 2022-04-03 NOTE — ED Provider Triage Note (Signed)
Emergency Medicine Provider Triage Evaluation Note  Walter Gonzales , a 58 y.o. male  was evaluated in triage.  Pt complains of constipation.  He does say that he still having small bowel movements and passing gas.  Does endorse some nausea, burping and vomiting.  Takes tramadol as needed and has not taken more recently.  Never had surgery on his abdomen Review of Systems  Positive:  Negative:   Physical Exam  Ht '6\' 3"'$  (1.905 m)   Wt 132.5 kg   BMI 36.50 kg/m  Gen:   Awake, no distress   Resp:  Normal effort  MSK:   Moves extremities witout difficulty  Other:    Medical Decision Making  Medically screening exam initiated at 2:39 PM.  Appropriate orders placed.  Walter Gonzales was informed that the remainder of the evaluation will be completed by another provider, this initial triage assessment does not replace that evaluation, and the importance of remaining in the ED until their evaluation is complete.     Walter Hammock, PA-C 04/03/22 1439

## 2022-04-03 NOTE — Discharge Instructions (Addendum)
Please go to the ER as soon as you leave urgent care for further evaluation and management.  

## 2022-04-06 ENCOUNTER — Ambulatory Visit: Payer: BC Managed Care – PPO | Attending: Cardiovascular Disease

## 2022-04-06 NOTE — Patient Instructions (Addendum)
Medication Instructions:  Continue current medicatons *If you need a refill on your cardiac medications before your next appointment, please call your pharmacy*   Lab Work: None If you have labs (blood work) drawn today and your tests are completely normal, you will receive your results only by: [place] (if you have MyChart) OR A paper copy in the mail If you have any lab test that is abnormal or we need to change your treatment, we will call you to review the results.   Testing/Procedures: Blood Pressure check   Follow-Up: At [facility], you and your health needs are our priority.  As part of our continuing mission to provide you with exceptional heart care, we have created designated Provider Care Teams.  These Care Teams include your primary Cardiologist (physician) and Advanced Practice Providers (APPs -  Physician Assistants and Nurse Practitioners) who all work together to provide you with the care you need, when you need it.  We recommend signing up for the patient portal called "MyChart".  Sign up information is provided on this After Visit Summary.  MyChart is used to connect with patients for Virtual Visits (Telemedicine).  Patients are able to view lab/test results, encounter notes, upcoming appointments, etc.  Non-urgent messages can be sent to your provider as well.   To learn more about what you can do with MyChart, go to [email or web address].    Your next appointment:   3-4 months  Provider:   Fransico Him M.D  Other Instructions Continue on current medications. Start checking blood pressure and heart rate at home. If you have repeated systolic pressure [id number] and above, please give our office a call. Continue low low-sodium, heart healthy diet. Blood pressure cuff given to patient today.

## 2022-04-06 NOTE — Progress Notes (Signed)
   Nurse Visit   Date of Encounter: 04/06/2022 ID: Marguerita Merles, DOB 03-09-1965, MRN [id number]  PCP:  Bonnita Nasuti, MD   [place] [facility] Prov[facility] Cardiologist:  Fransico Him, MD      Visit Details   VS:  BP 136/82 (BP Location: Right Arm, Patient Position: Sitting, Cuff Size: Large)   Pulse 83   Ht 6\' 3"  (1.905 m)   Wt 288 lb (130.6 kg)   BMI 36.00 kg/m  , BMI Body mass index is 36 kg/m.  Wt Readings from Last 3 Encounters:  04/06/22 288 lb (130.6 kg)  04/03/22 292 lb (132.5 kg)  03/23/22 294 lb 6.4 oz (133.5 kg)     Reason for visit: Repeat blood pressure Performed today: Vitals, Provider consulted:n/a, and Education Changes (medications, testing, etc.) : None Length of Visit: 30 minutes  Patient here for repeat blood pressure. He was not tracking blood pressure at home. He stated he was unable to get a blood pressure cuff. Today blood pressure was 136/82. Consulted with Johann Capers and she stated to continue on current medications. No changes from her last assessment. He was given a blood pressure cuff to track blood pressure and heart rate at home.  Patient is to give [place] a call if he have repeated systolic pressure 850 or above.  Dr. Myles Gip agrees with the plan.   Medications Adjustments/Labs and Tests Ordered: None    Signed, Taetum Flewellen Josephina Shih, LPN  03/08/7410 8:78 PM

## 2022-04-10 ENCOUNTER — Ambulatory Visit: Payer: Self-pay | Admitting: Adult Health

## 2022-04-11 ENCOUNTER — Telehealth: Payer: Self-pay | Admitting: *Deleted

## 2022-04-11 ENCOUNTER — Other Ambulatory Visit: Payer: Self-pay | Admitting: Physician Assistant

## 2022-04-11 DIAGNOSIS — I10 Essential (primary) hypertension: Secondary | ICD-10-CM

## 2022-04-11 NOTE — Telephone Encounter (Signed)
Reached out to patient and he does prefer a home sleep test. He states his insurance is up to date will precert once the order is put in.

## 2022-04-11 NOTE — Telephone Encounter (Signed)
-----   Message from Elgie Collard, [place] sent at 03/27/2022  5:30 PM EST ----- I'm not sure which one was ordered in the past but there was an insurance issue. I'm sure the patient would prefer an at home study.   Thank you! Elgie Collard, PA-C  ----- Message ----- From: Freada Bergeron, CMA Sent: 03/24/2022   1:39 PM EST To: Elgie Collard, PA-C  What sleep study would you like? There is not an order in for him. ----- Message ----- From: Miguel Aschoff Sent: 03/23/2022  10:40 PM EST To: Sueanne Margarita, MD; Freada Bergeron, CMA  For some reason insurance did not want to cover his previous sleep study.  Biggest issue is hypertension.  We have changed his medication today and asked him to follow-up with nursing visit.  He will see you in a few months.  Thanks.

## 2022-04-24 ENCOUNTER — Ambulatory Visit: Payer: BC Managed Care – PPO | Admitting: Nurse Practitioner

## 2022-04-25 NOTE — Telephone Encounter (Signed)
DENIED-Order ID: [id number] [place] [phone]

## 2022-04-27 NOTE — Progress Notes (Deleted)
Office Visit    Patient Name: Walter Gonzales Date of Encounter: 04/27/2022  PCP:  Bonnita Nasuti, MD   [place]  Cardiologist:  Fransico Him, MD  Advanced Practice Provider:  No care team member to display Electrophysiologist:  None   HPI    Walter Gonzales is a 57 y.o. male with a past medical history of hypertension, diabetes mellitus, hypercholesteremia, SVT, morbid obesity, hypothyroidism and gout presents today for follow-up appointment.  He was last seen August 2023 and then recently been in the ER for hypertension.  Apparently he was having some generalized weakness send on admission to ER blood pressure was 181/131 mmHg.  He had been compliant with his medication.  Initially seen in urgent care and then sent to the emergency room.  Was given IV labetalol 20 mg and HCTZ was resumed.  Troponin was negative x 2.  Current medication regimen includes HCTZ 12.5 mg daily and losartan 100 mg daily, also Lopressor 50 mg twice a day.  When he was seen in follow-up in August he was doing well.  No chest pressure or shortness of breath.  No lower extremity edema, dizziness, syncope.  Occasionally felt a skipped beat.  Tried to avoid salt.  Never been evaluated for OSA.  Feels sleepy when he wakes up in the morning on occasion.  No headache.  He does require daytime nap if he is home during the day.  He does snore at night.  Compliant with medications.  He was seen by me 03/23/22, he tells me that he has pain at the center of his chest.  Nonexertional.  It did not necessarily happen after eating but did start in the middle of the night and continued to when he woke up.  He is a part of a bowling league and does not get home until late usually eating around 9 or 10 PM sometimes getting fast food on the way home.  He was started on a PPI.  He has had some weight loss.  Problems with constipation and dehydration.  Dr. Radford Pax ordered a sleep study but his insurance would not cover  it.  I will send a message to Sydell Axon to problem shoot.   Today, he ***   Past Medical History    Past Medical History:  Diagnosis Date   Diabetes mellitus without complication (HCC)    Gout    Hypercholesterolemia    Hypertension    Morbid obesity (HCC)    SVT (supraventricular tachycardia)    adenosine sensitive short RP SVT   Thyroid disease    Hypothyroidism   Past Surgical History:  Procedure Laterality Date   ACHILLES TENDON REPAIR Right     Allergies  No Known Allergies  EKGs/Labs/Other Studies Reviewed:   The following studies were reviewed today: 10/2021 Renal US without renal artery stenosis  EKG:  EKG is not ordered today.   Recent Labs: 09/01/2021: TSH 2.171 04/03/2022: ALT 31; BUN 22; Creatinine, Ser 2.20; Hemoglobin 15.3; Platelets 210; Potassium 3.7; Sodium 138  Recent Lipid Panel    Component Value Date/Time   CHOL 187 10/05/2016 0003   TRIG 251 (H) 10/05/2016 0003   HDL 55 10/05/2016 0003   CHOLHDL 3.4 10/05/2016 0003   VLDL 50 (H) 10/05/2016 0003   LDLCALC 82 10/05/2016 0003   Home Medications   No outpatient medications have been marked as taking for the 04/28/22 encounter (Appointment) with Elgie Collard, PA-C.     Review of  Systems      All other systems reviewed and are otherwise negative except as noted above.  Physical Exam    VS:  There were no vitals taken for this visit. , BMI There is no height or weight on file to calculate BMI.  Wt Readings from Last 3 Encounters:  04/06/22 288 lb (130.6 kg)  04/03/22 292 lb (132.5 kg)  03/23/22 294 lb 6.4 oz (133.5 kg)     GEN: Well nourished, well developed, in no acute distress. HEENT: normal. Neck: Supple, no JVD, carotid bruits, or masses. Cardiac: RRR, no murmurs, rubs, or gallops. No clubbing, cyanosis, edema.  Radials/PT 2+ and equal bilaterally.  Respiratory:  Respirations regular and unlabored, clear to auscultation bilaterally. GI: Soft, nontender, nondistended. MS:  No deformity or atrophy. Skin: Warm and dry, no rash. Neuro:  Strength and sensation are intact. Psych: Normal affect.  Assessment & Plan    Hypertension -renal artery duplex negative -we switched his Hyzaar to Benicar for better BP control -asked him to track his HR and BP at home -borderline tachycardia on coreg 25mg  BID -continue low-sodium, heart healthy diet -2 week nursing visit  SVT -no repeat episodes at this time  OSA? -excessive daytime sleepiness and needing naps during the day -snoring -SVT, HTN, extreme fatigue -would recommend at home sleep study     Disposition: Follow up 3-4 with Fransico Him, MD or APP.  Signed, Elgie Collard, PA-C 04/27/2022, 8:45 PM [facility]

## 2022-04-28 ENCOUNTER — Ambulatory Visit: Payer: BC Managed Care – PPO | Admitting: Physician Assistant

## 2022-04-28 DIAGNOSIS — I471 Supraventricular tachycardia, unspecified: Secondary | ICD-10-CM

## 2022-04-28 DIAGNOSIS — G4733 Obstructive sleep apnea (adult) (pediatric): Secondary | ICD-10-CM

## 2022-04-28 DIAGNOSIS — I10 Essential (primary) hypertension: Secondary | ICD-10-CM

## 2022-05-01 ENCOUNTER — Ambulatory Visit: Payer: Self-pay | Admitting: Adult Health

## 2022-05-11 ENCOUNTER — Ambulatory Visit: Payer: BC Managed Care – PPO | Admitting: Adult Health

## 2022-06-01 ENCOUNTER — Ambulatory Visit: Payer: BC Managed Care – PPO | Admitting: Adult Health

## 2022-06-01 ENCOUNTER — Ambulatory Visit (INDEPENDENT_AMBULATORY_CARE_PROVIDER_SITE_OTHER): Payer: BC Managed Care – PPO | Admitting: Podiatry

## 2022-06-01 DIAGNOSIS — Z91199 Patient's noncompliance with other medical treatment and regimen due to unspecified reason: Secondary | ICD-10-CM

## 2022-06-01 NOTE — Progress Notes (Signed)
No show CG

## 2022-06-08 ENCOUNTER — Ambulatory Visit (INDEPENDENT_AMBULATORY_CARE_PROVIDER_SITE_OTHER): Payer: BC Managed Care – PPO | Admitting: Physician Assistant

## 2022-06-08 ENCOUNTER — Encounter: Payer: Self-pay | Admitting: Physician Assistant

## 2022-06-08 VITALS — BP 160/100 | HR 61 | Ht 75.0 in | Wt 306.0 lb

## 2022-06-08 DIAGNOSIS — E669 Obesity, unspecified: Secondary | ICD-10-CM | POA: Diagnosis not present

## 2022-06-08 DIAGNOSIS — K219 Gastro-esophageal reflux disease without esophagitis: Secondary | ICD-10-CM | POA: Diagnosis not present

## 2022-06-08 DIAGNOSIS — K5900 Constipation, unspecified: Secondary | ICD-10-CM | POA: Diagnosis not present

## 2022-06-08 DIAGNOSIS — E119 Type 2 diabetes mellitus without complications: Secondary | ICD-10-CM

## 2022-06-08 DIAGNOSIS — N183 Chronic kidney disease, stage 3 unspecified: Secondary | ICD-10-CM

## 2022-06-08 DIAGNOSIS — I10 Essential (primary) hypertension: Secondary | ICD-10-CM

## 2022-06-08 DIAGNOSIS — Z1211 Encounter for screening for malignant neoplasm of colon: Secondary | ICD-10-CM

## 2022-06-08 NOTE — Progress Notes (Addendum)
Subjective:    Patient ID: Walter Gonzales, male    DOB: [birth year], 57 y.o.   MRN: [id number]  HPI Walter Gonzales is a pleasant 58 year old African-American male, new to GI today referred after recent ER visit for constipation.  His PCP is Dr. Leona Carry [facility] [place]. He went CT of the abdomen pelvis on 04/03/2022 with the ER visit, was found to have a normal stool burden in the colon otherwise negative exam with exception of punctate bilateral renal calculi. Labs show WBC of 5.5/hemoglobin 14.3/hematocrit 44.9 BUN 22/creatinine 2.2.  Patient had been started on Trulicity within the 2 months prior to that ER visit and had been gradually increasing the dose.  He says that he had gotten himself dehydrated and did not realize that he was not drinking enough water etc. due to some of the side effects from the medication.  He has seen his PCP since, the dose of Trulicity has been decreased and he has been drinking at least 4-5 bottles of water per day.  He also has been started on Linzess 290 mcg daily which has been working well.  He says he has less GI symptoms as far as fullness etc. with the lower dose of Trulicity and his bowel movements are back to normal.  He is having bowel movements daily.  He has not noticed any melena or hematochezia. He does also take omeprazole chronically for GERD which controls symptoms well.  He did have prior colonoscopy about 6 years ago at [facility] and had EGD done at the same time.  He says that both of the exams were negative and that he did not have any polyps.  He believes the procedures were done by Dr. Lanae Boast.  No family history of colon cancer or other intestinal malignancies. Other medical issues include hypertension, history of SVT, last echo 2018 with EF 45 to 50%, obesity and adult onset diabetes mellitus.  Review of Systems Pertinent positive and negative review of systems were noted in the above HPI section.  All other review of  systems was otherwise negative.   Outpatient Encounter Medications as of 06/08/2022  Medication Sig   aspirin EC 81 MG tablet Take 81 mg by mouth once.   atorvastatin (LIPITOR) 40 MG tablet Take 1 tablet by mouth once daily for 30 days for 90 days   B Complex-C-Folic Acid (HM SUPER VITAMIN B COMPLEX/C PO) Take by mouth.   BLACK CURRANT SEED OIL PO Take by mouth.   carvedilol (COREG) 25 MG tablet Take 1 tablet (25 mg total) by mouth 2 (two) times daily.   Cinnamon 500 MG TABS Take 2,000 mg by mouth daily.   Dulaglutide (TRULICITY) 3 MG/0.5ML SOPN    empagliflozin (JARDIANCE) 10 MG TABS tablet Take 10 mg by mouth daily.   indomethacin (INDOCIN) 50 MG capsule 1 capsule with food or milk Orally Twice a day prn for 30 days   Lemborexant (DAYVIGO) 5 MG TABS Take by mouth.   levothyroxine (SYNTHROID, LEVOTHROID) 100 MCG tablet Take 100 mcg by mouth daily before breakfast.   linaclotide (LINZESS) 290 MCG CAPS capsule TAKE 1 CAPSULE BY MOUTH 30 MINUTES BEFORE THE FIRST MEAL OF THE DAY ON AN EMPTY STOMACH for 90 days   meloxicam (MOBIC) 15 MG tablet Take 15 mg by mouth daily.   metFORMIN (GLUCOPHAGE) 1000 MG tablet Take 1,000 mg by mouth 2 (two) times daily.   olmesartan-hydrochlorothiazide (BENICAR HCT) 40-12.5 MG tablet Take 1 tablet by mouth daily.  omeprazole (PRILOSEC) 40 MG capsule TAKE 1 CAPSULE BY MOUTH 30 MINUTES BEFORE MORNING MEAL ONCE DAILY   polyethylene glycol (MIRALAX / GLYCOLAX) 17 g packet Take 17 g by mouth daily as needed.   terbinafine (LAMISIL) 250 MG tablet Take 250 mg by mouth daily.   traMADol (ULTRAM) 50 MG tablet Take 50 mg by mouth daily as needed (pain).    Turmeric (QC TUMERIC COMPLEX) 500 MG CAPS Take by mouth.   Vitamin D, Ergocalciferol, (DRISDOL) 50000 units CAPS capsule Take 50,000 Units by mouth 2 (two) times a week. Monday and Tuesday   zinc gluconate 50 MG tablet Take 50 mg by mouth daily.   [DISCONTINUED] pantoprazole (PROTONIX) 20 MG tablet Take 1 tablet (20 mg  total) by mouth daily for 14 days.   No facility-administered encounter medications on file as of 06/08/2022.   No Known Allergies Patient Active Problem List   Diagnosis Date Noted   SVT (supraventricular tachycardia) 10/04/2016   Elevated troponin 10/04/2016   Morbid obesity (HCC) 10/04/2016   Type 2 diabetes mellitus (HCC) 10/04/2016   Snoring 10/04/2016   Essential hypertension 10/04/2016   Tachycardia 10/04/2016   CKD (chronic kidney disease), stage III (HCC) 10/04/2016   Social History   Socioeconomic History   Marital status: Married    Spouse name: Not on file   Number of children: 1   Years of education: Not on file   Highest education level: Not on file  Occupational History   Not on file  Tobacco Use   Smoking status: Never   Smokeless tobacco: Never  Vaping Use   Vaping Use: Never used  Substance and Sexual Activity   Alcohol use: Yes    Comment: occ   Drug use: No   Sexual activity: Yes  Other Topics Concern   Not on file  Social History Narrative   Lives in [place]    Mental health care worker   Social Determinants of Health   Financial Resource Strain: Not on file  Food Insecurity: Not on file  Transportation Needs: Not on file  Physical Activity: Not on file  Stress: Not on file  Social Connections: Not on file  Intimate Partner Violence: Not on file    Walter Gonzales family history includes Diabetes in his maternal grandmother; Heart attack in his father and mother; Heart failure in his maternal grandmother; High Cholesterol in his maternal grandmother; High blood pressure in his maternal grandmother.      Objective:    Vitals:   06/08/22 0931  BP: (!) 160/100  Pulse: 61    Physical Exam Well-developed well-nourished older African-American male in no acute distress.  Very pleasant height, Weight, 306 BMI 38.2  HEENT; nontraumatic normocephalic, EOMI, PE R LA, sclera anicteric. Oropharynx; Neck; supple, no JVD Cardiovascular; regular  rate and rhythm with S1-S2, no murmur rub or gallop Pulmonary; Clear bilaterally Abdomen; soft, obese, nontender, nondistended, no palpable mass or hepatosplenomegaly, bowel sounds are active Rectal; not done Skin; benign exam, no jaundice rash or appreciable lesions Extremities; no clubbing cyanosis or edema skin warm and dry Neuro/Psych; alert and oriented x4, grossly nonfocal mood and affect appropriate        Assessment & Plan:   #43 57 year old African-American male with recent ER visit on 04/03/2022 with abdominal discomfort and constipation. CT abdomen pelvis negative other than bilateral punctate renal calculi. Labs with normal hemoglobin at 14.3. Creatinine was elevated at 2.2 higher than his baseline with chronic kidney disease and felt to be  a bit dehydrated.  Walter Gonzales discomfort and constipation had occurred in setting of recently starting Trulicity with escalating dosages.  His dosage has since been decreased and he feels better. He has been drinking much more water and bowel movements have returned to normal. Also using Linzess 290 mcg daily which she tolerates well.  He did have a screening colonoscopy about 6 years ago at [facility] which was negative per patient report  #2 chronic GERD controlled on omeprazole 40 mg daily-prior negative screening EGD [facility] #3 adult onset diabetes mellitus 4.  Obesity-he has had some weight loss on Trulicity 5.  Chronic kidney disease stage III 6.  History of SVT 7.  Hypertension 8.  Reduced EF as of echo 2018 45 to 50%  Plan; Patient will sign a release and will obtain copies of his prior colonoscopy and EGD. I do not think he needs to have another colonoscopy at this time as constipation and abdominal discomfort occurred in the setting of Trulicity which had recently been started. Continue Linzess 290 mcg daily. Continue omeprazole 40 mg p.o. every morning AC. Patient will be established with Dr. Barron Alvine. Once we  receive copies of his prior colonoscopy, can place him in our recall system for follow-up colonoscopy. Happy to see him as needed for any GI problems.  Addendum-received records from [facility]-colonoscopy was done December 2020 Dr. Al Corpus Shahid this was a negative exam with exception of medium sized internal hemorrhoids and indicated for 10-year interval follow-up. He also underwent EGD 01/20/2019 with finding of irregular Z-line, gastritis and a small hiatal hernia.  Path showed benign GE junction with mild chronic inflammation and increased eosinophils, negative for intestinal metaplasia, distal esophagus with a few focal eosinophils up to 5 in a single high-power field.  Walter Gonzales Walter Jasslyn Finkel PA-C 06/08/2022   Cc: Galvin Proffer, MD

## 2022-06-08 NOTE — Patient Instructions (Addendum)
_______________________________________________________  If your blood pressure at your visit was 140/90 or greater, please contact your primary care physician to follow up on this.  If you are age 58 or younger, your body mass index should be between 19-25. Your Body mass index is 38.25 kg/m. If this is out of the aformentioned range listed, please consider follow up with your Primary Care Provider.  ________________________________________________________  The [place] providers would like to encourage you to use [facility] to communicate with providers for non-urgent requests or questions.  Due to long hold times on the telephone, sending your provider a message by [facility] may be a faster and more efficient way to get a response.  Please allow 48 business hours for a response.  Please remember that this is for non-urgent requests.  _______________________________________________________  Continue drinking 4 bottles of water daily. Continue high fiber diet.  CONITNUE:Omeprazole 40mg  one capsule daily 30 minutes prior to breakfast meal each day. CONTINUE:Linzess 290 daily or every other day.  We will attempt to obtain your previous records from [facility].  You will follow up in our office in 1 year or sooner if needed.  Thank you for entrusting me with your care and choosing [facility].  Amy Esterwood, PA-C

## 2022-06-13 ENCOUNTER — Telehealth: Payer: Self-pay

## 2022-06-13 NOTE — Telephone Encounter (Signed)
-----   Message from Sammuel Cooper, PA-C sent at 06/13/2022  1:35 PM EDT ----- Regarding: recall colon I saw this patient last week in the office, received copies of prior endoscopic evaluation.  He had negative colonoscopy in December 2020 through [facility]  Can you please place him in our system for recall colonoscopy December 2030/Cirigliano Enck you

## 2022-06-13 NOTE — Telephone Encounter (Signed)
Recall entered into EPIC 

## 2022-06-22 ENCOUNTER — Ambulatory Visit: Payer: BC Managed Care – PPO | Admitting: Adult Health

## 2022-06-28 NOTE — Progress Notes (Signed)
Agree with the assessment and plan as outlined by Amy Esterwood, PA-C.  Izola Teague, DO, FACG  

## 2022-06-29 ENCOUNTER — Ambulatory Visit: Payer: BC Managed Care – PPO | Admitting: Physician Assistant

## 2022-06-29 ENCOUNTER — Ambulatory Visit: Payer: BC Managed Care – PPO | Admitting: Adult Health

## 2022-07-13 ENCOUNTER — Ambulatory Visit: Payer: BC Managed Care – PPO | Admitting: Adult Health

## 2022-07-19 ENCOUNTER — Ambulatory Visit: Payer: BC Managed Care – PPO | Admitting: Adult Health

## 2022-07-19 ENCOUNTER — Encounter: Payer: Self-pay | Admitting: Adult Health

## 2022-07-19 VITALS — BP 142/112 | HR 85 | Temp 97.2°F | Resp 18 | Ht 75.0 in | Wt 309.8 lb

## 2022-07-19 DIAGNOSIS — Z125 Encounter for screening for malignant neoplasm of prostate: Secondary | ICD-10-CM

## 2022-07-19 DIAGNOSIS — I10 Essential (primary) hypertension: Secondary | ICD-10-CM

## 2022-07-19 DIAGNOSIS — Z113 Encounter for screening for infections with a predominantly sexual mode of transmission: Secondary | ICD-10-CM

## 2022-07-19 DIAGNOSIS — Z7689 Persons encountering health services in other specified circumstances: Secondary | ICD-10-CM

## 2022-07-19 DIAGNOSIS — E785 Hyperlipidemia, unspecified: Secondary | ICD-10-CM

## 2022-07-19 DIAGNOSIS — I471 Supraventricular tachycardia, unspecified: Secondary | ICD-10-CM

## 2022-07-19 DIAGNOSIS — N1832 Chronic kidney disease, stage 3b: Secondary | ICD-10-CM

## 2022-07-19 DIAGNOSIS — E1121 Type 2 diabetes mellitus with diabetic nephropathy: Secondary | ICD-10-CM | POA: Diagnosis not present

## 2022-07-19 DIAGNOSIS — M170 Bilateral primary osteoarthritis of knee: Secondary | ICD-10-CM

## 2022-07-19 DIAGNOSIS — E039 Hypothyroidism, unspecified: Secondary | ICD-10-CM

## 2022-07-19 DIAGNOSIS — K219 Gastro-esophageal reflux disease without esophagitis: Secondary | ICD-10-CM

## 2022-07-19 NOTE — Patient Instructions (Signed)

## 2022-07-19 NOTE — Progress Notes (Signed)
[facility]  Provider: Kenard Gower DNP  Code Status:  Full Code  Goals of Care:     07/19/2022   12:39 PM  Advanced Directives  Does Patient Have a Medical Advance Directive? No  Would patient like information on creating a medical advance directive? No - Patient declined     Chief Complaint  Patient presents with   Establish Care    New Patient.     HPI: Patient is a 57 y.o. male seen today to establish care with [facility]. He has a BS degree and works in [employer] production pl[employer]. His father died due to heart attack but not sure at what age. His mother is living and has diabetes and heart issues., 58 Y/O. He is an only child and married. He has a daughter who is healthy, 76 Y/O. He has some exercise wherein he does walking and bowling. He plans to join [employer]. He does not smoke nor drink alcohol.  Past Medical History:  Diagnosis Date   Diabetes mellitus without complication (HCC)    Gout    Hypercholesterolemia    Hypertension    Morbid obesity (HCC)    SVT (supraventricular tachycardia)    adenosine sensitive short RP SVT   Thyroid disease    Hypothyroidism    Past Surgical History:  Procedure Laterality Date   ACHILLES TENDON REPAIR Right     No Known Allergies  Outpatient Encounter Medications as of 07/19/2022  Medication Sig   aspirin EC 81 MG tablet Take 81 mg by mouth once.   atorvastatin (LIPITOR) 40 MG tablet Take 1 tablet by mouth once daily for 30 days for 90 days   B Complex-C-Folic Acid (HM SUPER VITAMIN B COMPLEX/C PO) Take by mouth.   BLACK CURRANT SEED OIL PO Take by mouth.   carvedilol (COREG) 25 MG tablet Take 1 tablet (25 mg total) by mouth 2 (two) times daily.   Cinnamon 500 MG TABS Take 2,000 mg by mouth daily.   Dulaglutide (TRULICITY) 3 MG/0.5ML SOPN    empagliflozin (JARDIANCE) 10 MG TABS tablet Take 10 mg by mouth daily.   indomethacin (INDOCIN) 50 MG capsule 1 capsule with food or milk Orally Twice a day prn for 30 days   levothyroxine  (SYNTHROID, LEVOTHROID) 100 MCG tablet Take 100 mcg by mouth daily before breakfast.   linaclotide (LINZESS) 290 MCG CAPS capsule TAKE 1 CAPSULE BY MOUTH 30 MINUTES BEFORE THE FIRST MEAL OF THE DAY ON AN EMPTY STOMACH for 90 days   meloxicam (MOBIC) 15 MG tablet Take 15 mg by mouth daily.   metFORMIN (GLUCOPHAGE) 1000 MG tablet Take 1,000 mg by mouth 2 (two) times daily.   olmesartan-hydrochlorothiazide (BENICAR HCT) 40-12.5 MG tablet Take 1 tablet by mouth daily.   omeprazole (PRILOSEC) 40 MG capsule TAKE 1 CAPSULE BY MOUTH 30 MINUTES BEFORE MORNING MEAL ONCE DAILY   polyethylene glycol (MIRALAX / GLYCOLAX) 17 g packet Take 17 g by mouth daily as needed.   terbinafine (LAMISIL) 250 MG tablet Take 250 mg by mouth daily.   traMADol (ULTRAM) 50 MG tablet Take 50 mg by mouth daily as needed (pain).    Turmeric (QC TUMERIC COMPLEX) 500 MG CAPS Take by mouth.   Vitamin D, Ergocalciferol, (DRISDOL) 50000 units CAPS capsule Take 50,000 Units by mouth 2 (two) times a week. Monday and Tuesday   zinc gluconate 50 MG tablet Take 50 mg by mouth daily.   [DISCONTINUED] Lemborexant (DAYVIGO) 5 MG TABS Take by mouth.   No  facility-administered encounter medications on file as of 07/19/2022.    Review of Systems:  Review of Systems  Constitutional:  Negative for activity change, appetite change and fever.  HENT:  Negative for sore throat.   Eyes: Negative.   Cardiovascular:  Negative for chest pain and leg swelling.  Gastrointestinal:  Negative for abdominal distention, diarrhea and vomiting.  Genitourinary:  Negative for dysuria, frequency and urgency.  Skin:  Negative for color change.  Neurological:  Negative for dizziness and headaches.  Psychiatric/Behavioral:  Negative for behavioral problems and sleep disturbance. The patient is not nervous/anxious.     Health Maintenance  Topic Date Due   COVID-19 Vaccine (1) Never done   FOOT EXAM  Never done   OPHTHALMOLOGY EXAM  Never done   Diabetic  kidney evaluation - Urine ACR  Never done   Hepatitis C Screening  Never done   DTaP/Tdap/Td (1 - Tdap) Never done   Zoster Vaccines- Shingrix (1 of 2) Never done   HEMOGLOBIN A1C  04/03/2017   INFLUENZA VACCINE  08/31/2022   Diabetic kidney evaluation - eGFR measurement  04/03/2023   Colonoscopy  01/19/2029   HIV Screening  Completed   HPV VACCINES  Aged Out    Physical Exam: Vitals:   07/19/22 1231  BP: (!) 158/112  Pulse: 85  Resp: 18  Temp: (!) 97.2 F (36.2 C)  SpO2: 98%  Weight: (!) 309 lb 12.8 oz (140.5 kg)  Height: 6\' 3"  (1.905 m)   Body mass index is 38.72 kg/m. Physical Exam Constitutional:      Appearance: He is obese.  HENT:     Head: Normocephalic and atraumatic.     Mouth/Throat:     Mouth: Mucous membranes are moist.  Eyes:     Conjunctiva/sclera: Conjunctivae normal.  Cardiovascular:     Rate and Rhythm: Normal rate and regular rhythm.     Pulses: Normal pulses.     Heart sounds: Normal heart sounds.  Pulmonary:     Effort: Pulmonary effort is normal.     Breath sounds: Normal breath sounds.  Abdominal:     General: Bowel sounds are normal.     Palpations: Abdomen is soft.  Musculoskeletal:        General: No swelling. Normal range of motion.     Cervical back: Normal range of motion.  Skin:    General: Skin is warm and dry.  Neurological:     General: No focal deficit present.     Mental Status: He is alert and oriented to person, place, and time.  Psychiatric:        Mood and Affect: Mood normal.        Behavior: Behavior normal.        Thought Content: Thought content normal.        Judgment: Judgment normal.     Labs reviewed: Basic Metabolic Panel: Recent Labs    09/01/21 1850 09/01/21 1921 03/15/22 1146 04/03/22 1449 04/03/22 1455  NA 139  --  136 139 138  K 3.7  --  3.3* 3.6 3.7  CL 105  --  100 100 102  CO2 24  --  23 25  --   GLUCOSE 122*  --  148* 120* 118*  BUN 17  --  14 18 22*  CREATININE 1.45*  --  1.41* 1.99*  2.20*  CALCIUM 9.6  --  9.6 9.9  --   TSH  --  2.171  --   --   --  Liver Function Tests: Recent Labs    09/01/21 1850 03/15/22 1314 04/03/22 1449  AST 24 28 27   ALT 32 28 31  ALKPHOS 91 76 69  BILITOT 1.6* 1.3* 1.4*  PROT 7.0 7.3 7.4  ALBUMIN 4.1 4.3 4.4   Recent Labs    03/15/22 1314 04/03/22 1449  LIPASE 32 41   No results for input(s): "AMMONIA" in the last 8760 hours. CBC: Recent Labs    09/01/21 1850 03/15/22 1146 04/03/22 1449 04/03/22 1455  WBC 7.9 7.8 5.5  --   NEUTROABS  --   --  2.6  --   HGB 15.2 14.3 14.3 15.3  HCT 48.9 45.4 44.9 45.0  MCV 81.1 81.5 80.9  --   PLT 219 226 210  --    Lipid Panel: No results for input(s): "CHOL", "HDL", "LDLCALC", "TRIG", "CHOLHDL", "LDLDIRECT" in the last 8760 hours. Lab Results  Component Value Date   HGBA1C 7.8 (H) 10/04/2016    Procedures since last visit: No results found.  Assessment/Plan  1. Encounter to establish care -  established care with [facility]  2. Type 2 diabetes mellitus with diabetic nephropathy, without long-term current use of insulin (HCC) Lab Results  Component Value Date   HGBA1C 7.8 (H) 10/04/2016   -  continue Trulicity, Jardiance and Metformin - CBC With Differential/Platelet; Future - Hemoglobin A1C; Future  3. Essential hypertension -  BP 158/112, 142/112 - continue Olmesartan-hydrochlorothiazide and Coreg - Complete Metabolic Panel with eGFR; Future  4. SVT (supraventricular tachycardia) -   rate-controlled -  continue Coreg - CBC With Differential/Platelet; Future - TSH; Future  5. Stage 3b chronic kidney disease [facility]) Lab Results  Component Value Date   NA 138 04/03/2022   K 3.7 04/03/2022   CO2 25 04/03/2022   GLUCOSE 118 (H) 04/03/2022   BUN 22 (H) 04/03/2022   CREATININE 2.20 (H) 04/03/2022   CALCIUM 9.9 04/03/2022   GFRNONAA 38 (L) 04/03/2022   -  will monitor  6. Screening for prostate cancer -  denies urinary retention - PSA, Total and Free;  Future  7. Hyperlipidemia, unspecified hyperlipidemia type Lab Results  Component Value Date   CHOL 187 10/05/2016   HDL 55 10/05/2016   LDLCALC 82 10/05/2016   TRIG 251 (H) 10/05/2016   CHOLHDL 3.4 10/05/2016   -  continue Atorvastatin - Lipid panel; Future  8. Morbid obesity (HCC) Body mass index is 38.72 kg/m. -  plans to join [facility] -  recently cut down on fried foods and increase vegetable intake   9. Acquired hypothyroidism Lab Results  Component Value Date   TSH 2.171 09/01/2021   -  continue Levothyroxine - TSH; Future  10. Gastroesophageal reflux disease without esophagitis -  continue Omeprazole  11. Screening for STD (sexually transmitted disease) - Hep C Antibody; Future    Labs/tests ordered:  CBC, CMP lipid panel. A1C, tsh, hep C antibody and PSA  Next appt:  Visit date not found

## 2022-07-27 ENCOUNTER — Other Ambulatory Visit: Payer: BC Managed Care – PPO

## 2022-07-27 DIAGNOSIS — Z113 Encounter for screening for infections with a predominantly sexual mode of transmission: Secondary | ICD-10-CM

## 2022-07-27 DIAGNOSIS — E1121 Type 2 diabetes mellitus with diabetic nephropathy: Secondary | ICD-10-CM

## 2022-07-27 DIAGNOSIS — E039 Hypothyroidism, unspecified: Secondary | ICD-10-CM

## 2022-07-27 DIAGNOSIS — E785 Hyperlipidemia, unspecified: Secondary | ICD-10-CM

## 2022-07-27 DIAGNOSIS — Z125 Encounter for screening for malignant neoplasm of prostate: Secondary | ICD-10-CM

## 2022-07-27 DIAGNOSIS — I471 Supraventricular tachycardia, unspecified: Secondary | ICD-10-CM

## 2022-07-27 DIAGNOSIS — I10 Essential (primary) hypertension: Secondary | ICD-10-CM

## 2022-07-28 NOTE — Progress Notes (Signed)
-    A1C 7.2, down from 7.8 ( 5 years ago), improved, continue current medications, check CBG daily, log and bring to next appointment -  CBC within normal, not anemic -  GFR 59, ranging as CKD 3a, improved from 38 (04/03/22) -  electrolytes and liver enzymes within normal -  hep C antibody negative -  cholesterol, triglycerides and LDL, all normal -  tsh normal, continue current Levothyroxine

## 2022-07-31 LAB — COMPLETE METABOLIC PANEL WITH GFR
AG Ratio: 2.1 (calc) (ref 1.0–2.5)
ALT: 23 U/L (ref 9–46)
AST: 15 U/L (ref 10–35)
Albumin: 4.4 g/dL (ref 3.6–5.1)
Alkaline phosphatase (APISO): 99 U/L (ref 35–144)
BUN/Creatinine Ratio: 14 (calc) (ref 6–22)
BUN: 19 mg/dL (ref 7–25)
CO2: 23 mmol/L (ref 20–32)
Calcium: 9.5 mg/dL (ref 8.6–10.3)
Chloride: 107 mmol/L (ref 98–110)
Creat: 1.4 mg/dL — ABNORMAL HIGH (ref 0.70–1.30)
Globulin: 2.1 g/dL (calc) (ref 1.9–3.7)
Glucose, Bld: 131 mg/dL — ABNORMAL HIGH (ref 65–99)
Potassium: 4.2 mmol/L (ref 3.5–5.3)
Sodium: 142 mmol/L (ref 135–146)
Total Bilirubin: 0.5 mg/dL (ref 0.2–1.2)
Total Protein: 6.5 g/dL (ref 6.1–8.1)
eGFR: 59 mL/min/{1.73_m2} — ABNORMAL LOW (ref >=60)

## 2022-07-31 LAB — CBC WITH DIFFERENTIAL/PLATELET
Absolute Monocytes: 543 cells/uL (ref 200–950)
Basophils Absolute: 20 cells/uL (ref 0–200)
Basophils Relative: 0.3 %
Eosinophils Absolute: 214 cells/uL (ref 15–500)
Eosinophils Relative: 3.2 %
HCT: 43 % (ref 38.5–50.0)
Hemoglobin: 13.6 g/dL (ref 13.2–17.1)
Lymphs Abs: 2218 cells/uL (ref 850–3900)
MCH: 25.7 pg — ABNORMAL LOW (ref 27.0–33.0)
MCHC: 31.6 g/dL — ABNORMAL LOW (ref 32.0–36.0)
MCV: 81.3 fL (ref 80.0–100.0)
MPV: 11.2 fL (ref 7.5–12.5)
Monocytes Relative: 8.1 %
Neutro Abs: 3705 cells/uL (ref 1500–7800)
Neutrophils Relative %: 55.3 %
Platelets: 222 10*3/uL (ref 140–400)
RBC: 5.29 10*6/uL (ref 4.20–5.80)
RDW: 14 % (ref 11.0–15.0)
Total Lymphocyte: 33.1 %
WBC: 6.7 10*3/uL (ref 3.8–10.8)

## 2022-07-31 LAB — HEMOGLOBIN A1C
Hgb A1c MFr Bld: 7.2 % of total Hgb — ABNORMAL HIGH (ref <5.7)
Mean Plasma Glucose: 160 mg/dL
eAG (mmol/L): 8.9 mmol/L

## 2022-07-31 LAB — LIPID PANEL
Cholesterol: 142 mg/dL (ref <200)
HDL: 70 mg/dL (ref >=40)
LDL Cholesterol (Calc): 56 mg/dL (calc)
Non-HDL Cholesterol (Calc): 72 mg/dL (calc) (ref <130)
Total CHOL/HDL Ratio: 2 (calc) (ref <5.0)
Triglycerides: 84 mg/dL (ref <150)

## 2022-07-31 LAB — PSA, TOTAL AND FREE
PSA, % Free: 18 % (calc) — ABNORMAL LOW (ref >25)
PSA, Free: 0.4 ng/mL
PSA, Total: 2.2 ng/mL (ref <=4.0)

## 2022-07-31 LAB — TSH: TSH: 3.34 mIU/L (ref 0.40–4.50)

## 2022-07-31 LAB — HEPATITIS C ANTIBODY: Hepatitis C Ab: NONREACTIVE

## 2022-07-31 NOTE — Progress Notes (Signed)
PSA is normal.

## 2022-08-10 ENCOUNTER — Other Ambulatory Visit: Payer: Self-pay

## 2022-08-10 ENCOUNTER — Ambulatory Visit: Payer: BC Managed Care – PPO | Admitting: Adult Health

## 2022-08-15 ENCOUNTER — Other Ambulatory Visit (HOSPITAL_COMMUNITY): Payer: Self-pay

## 2022-08-16 ENCOUNTER — Other Ambulatory Visit (HOSPITAL_COMMUNITY): Payer: Self-pay

## 2022-08-16 MED ORDER — TRULICITY 3 MG/0.5ML ~~LOC~~ SOAJ
3.0000 mg | SUBCUTANEOUS | 1 refills | Status: DC
  Filled 2022-08-16 – 2022-10-30 (×4): qty 2, 28d supply, fill #0
  Filled 2022-12-02: qty 2, 28d supply, fill #1

## 2022-08-24 ENCOUNTER — Ambulatory Visit: Payer: BC Managed Care – PPO | Admitting: Adult Health

## 2022-08-24 ENCOUNTER — Ambulatory Visit: Payer: BC Managed Care – PPO | Admitting: Cardiology

## 2022-08-28 ENCOUNTER — Ambulatory Visit: Payer: BC Managed Care – PPO | Admitting: Cardiology

## 2022-08-31 ENCOUNTER — Other Ambulatory Visit (HOSPITAL_COMMUNITY): Payer: Self-pay

## 2022-08-31 ENCOUNTER — Ambulatory Visit: Payer: BC Managed Care – PPO | Admitting: Adult Health

## 2022-08-31 ENCOUNTER — Other Ambulatory Visit: Payer: Self-pay

## 2022-08-31 MED ORDER — TRULICITY 3 MG/0.5ML ~~LOC~~ SOAJ
3.0000 mg | SUBCUTANEOUS | 1 refills | Status: DC
  Filled 2022-08-31: qty 2, 28d supply, fill #0
  Filled 2022-10-04: qty 2, 28d supply, fill #1

## 2022-09-01 ENCOUNTER — Other Ambulatory Visit (HOSPITAL_COMMUNITY): Payer: Self-pay

## 2022-09-07 ENCOUNTER — Ambulatory Visit (INDEPENDENT_AMBULATORY_CARE_PROVIDER_SITE_OTHER): Payer: BC Managed Care – PPO | Admitting: Adult Health

## 2022-09-07 ENCOUNTER — Encounter: Payer: Self-pay | Admitting: Adult Health

## 2022-09-07 VITALS — BP 128/88 | HR 81 | Temp 97.1°F | Resp 18 | Ht 75.0 in | Wt 308.6 lb

## 2022-09-07 DIAGNOSIS — K5909 Other constipation: Secondary | ICD-10-CM

## 2022-09-07 DIAGNOSIS — E1121 Type 2 diabetes mellitus with diabetic nephropathy: Secondary | ICD-10-CM | POA: Diagnosis not present

## 2022-09-07 DIAGNOSIS — E039 Hypothyroidism, unspecified: Secondary | ICD-10-CM | POA: Diagnosis not present

## 2022-09-07 DIAGNOSIS — N1831 Chronic kidney disease, stage 3a: Secondary | ICD-10-CM

## 2022-09-07 DIAGNOSIS — E785 Hyperlipidemia, unspecified: Secondary | ICD-10-CM | POA: Diagnosis not present

## 2022-09-07 DIAGNOSIS — E559 Vitamin D deficiency, unspecified: Secondary | ICD-10-CM

## 2022-09-07 DIAGNOSIS — K219 Gastro-esophageal reflux disease without esophagitis: Secondary | ICD-10-CM

## 2022-09-07 DIAGNOSIS — G8929 Other chronic pain: Secondary | ICD-10-CM

## 2022-09-07 DIAGNOSIS — I10 Essential (primary) hypertension: Secondary | ICD-10-CM

## 2022-09-07 DIAGNOSIS — Z6838 Body mass index (BMI) 38.0-38.9, adult: Secondary | ICD-10-CM

## 2022-09-07 DIAGNOSIS — E669 Obesity, unspecified: Secondary | ICD-10-CM

## 2022-09-07 DIAGNOSIS — M25562 Pain in left knee: Secondary | ICD-10-CM

## 2022-09-07 DIAGNOSIS — M25561 Pain in right knee: Secondary | ICD-10-CM

## 2022-09-07 NOTE — Progress Notes (Signed)
[facility] clinic  Provider:  Kenard Gower DNP  Code Status:  Full Code  Goals of Care:     09/07/2022    8:34 AM  Advanced Directives  Does Patient Have a Medical Advance Directive? No  Would patient like information on creating a medical advance directive? No - Patient declined     Chief Complaint  Patient presents with   Follow-up    Three week follow-up   Quality Metric Gaps    Needs to eye exam and foot exam, Diabetic kidney evaluation-Urine ACR, DTAP, Shingrix, Flu vaccine.    HPI: Patient is a 57 y.o. male seen today follow up of chronic medical issues.  Type 2 diabetes mellitus with diabetic nephropathy, without long-term current use of insulin (HCC) -   A1C 7.6, stated that his average CBG 130, takes Trulicity, Jardiance and Metformin  Essential hypertension  -  BP 128/88, takes Carvedilol and Olmesartan  Acquired hypothyroidism -  tsh 3.34, takes Levothyroxine  Hyperlipidemia, unspecified hyperlipidemia type  -  cholesterol 142, triglycerides 84 and LDL 56, takes Atorvastatin  Gastroesophageal reflux disease without esophagitis  -  denies heartburns, takes Omeprazole  Chronic constipation  -  has regular BMs, takes Linzess and Miralax PRN  Vitamin D deficiency - Plan: takes vitamin D 50,000 units PO twice a week    Wt Readings from Last 3 Encounters:  09/07/22 (!) 308 lb 9.6 oz (140 kg)  07/19/22 (!) 309 lb 12.8 oz (140.5 kg)  06/08/22 (!) 306 lb (138.8 kg)     Past Medical History:  Diagnosis Date   Diabetes mellitus without complication (HCC)    Gout    Hypercholesterolemia    Hypertension    Morbid obesity (HCC)    SVT (supraventricular tachycardia)    adenosine sensitive short RP SVT   Thyroid disease    Hypothyroidism    Past Surgical History:  Procedure Laterality Date   ACHILLES TENDON REPAIR Right     No Known Allergies  Outpatient Encounter Medications as of 09/07/2022  Medication Sig   aspirin EC 81 MG tablet Take 81 mg by  mouth once.   atorvastatin (LIPITOR) 40 MG tablet Take 1 tablet by mouth once daily for 30 days for 90 days   B Complex-C-Folic Acid (HM SUPER VITAMIN B COMPLEX/C PO) Take by mouth.   BLACK CURRANT SEED OIL PO Take by mouth.   carvedilol (COREG) 25 MG tablet Take 1 tablet (25 mg total) by mouth 2 (two) times daily.   Cinnamon 500 MG TABS Take 2,000 mg by mouth daily.   Dulaglutide (TRULICITY) 3 MG/0.5ML SOPN    Dulaglutide (TRULICITY) 3 MG/0.5ML SOPN Inject 3 mg into the skin once a week.   Dulaglutide (TRULICITY) 3 MG/0.5ML SOPN Inject 3 mg into the skin once a week.   empagliflozin (JARDIANCE) 10 MG TABS tablet Take 10 mg by mouth daily.   indomethacin (INDOCIN) 50 MG capsule 1 capsule with food or milk Orally Twice a day prn for 30 days   levothyroxine (SYNTHROID, LEVOTHROID) 100 MCG tablet Take 100 mcg by mouth daily before breakfast.   linaclotide (LINZESS) 290 MCG CAPS capsule TAKE 1 CAPSULE BY MOUTH 30 MINUTES BEFORE THE FIRST MEAL OF THE DAY ON AN EMPTY STOMACH for 90 days   meloxicam (MOBIC) 15 MG tablet Take 15 mg by mouth daily.   metFORMIN (GLUCOPHAGE) 1000 MG tablet Take 1,000 mg by mouth 2 (two) times daily.   olmesartan-hydrochlorothiazide (BENICAR HCT) 40-12.5 MG tablet Take 1 tablet  by mouth daily.   omeprazole (PRILOSEC) 40 MG capsule TAKE 1 CAPSULE BY MOUTH 30 MINUTES BEFORE MORNING MEAL ONCE DAILY   polyethylene glycol (MIRALAX / GLYCOLAX) 17 g packet Take 17 g by mouth daily as needed.   terbinafine (LAMISIL) 250 MG tablet Take 250 mg by mouth daily.   traMADol (ULTRAM) 50 MG tablet Take 50 mg by mouth daily as needed (pain).    Turmeric (QC TUMERIC COMPLEX) 500 MG CAPS Take by mouth.   Vitamin D, Ergocalciferol, (DRISDOL) 50000 units CAPS capsule Take 50,000 Units by mouth 2 (two) times a week. Monday and Tuesday   zinc gluconate 50 MG tablet Take 50 mg by mouth daily.   No facility-administered encounter medications on file as of 09/07/2022.    Review of Systems:   Review of Systems  Constitutional:  Negative for activity change, appetite change and fever.  HENT:  Negative for sore throat.   Eyes: Negative.   Cardiovascular:  Negative for chest pain and leg swelling.  Gastrointestinal:  Negative for abdominal distention, diarrhea and vomiting.  Genitourinary:  Negative for dysuria, frequency and urgency.  Skin:  Negative for color change.  Neurological:  Negative for dizziness and headaches.  Psychiatric/Behavioral:  Negative for behavioral problems and sleep disturbance. The patient is not nervous/anxious.     Health Maintenance  Topic Date Due   COVID-19 Vaccine (1) Never done   FOOT EXAM  Never done   OPHTHALMOLOGY EXAM  Never done   Diabetic kidney evaluation - Urine ACR  Never done   DTaP/Tdap/Td (1 - Tdap) Never done   Zoster Vaccines- Shingrix (1 of 2) Never done   INFLUENZA VACCINE  08/31/2022   HEMOGLOBIN A1C  01/26/2023   Diabetic kidney evaluation - eGFR measurement  07/27/2023   Colonoscopy  01/19/2029   Hepatitis C Screening  Completed   HIV Screening  Completed   HPV VACCINES  Aged Out    Physical Exam: Vitals:   09/07/22 0834  BP: 128/88  Pulse: 81  Resp: 18  Temp: (!) 97.1 F (36.2 C)  SpO2: 96%  Weight: (!) 308 lb 9.6 oz (140 kg)  Height: 6\' 3"  (1.905 m)   Body mass index is 38.57 kg/m. Physical Exam Constitutional:      Appearance: Normal appearance. He is obese.  HENT:     Head: Normocephalic and atraumatic.     Mouth/Throat:     Mouth: Mucous membranes are moist.  Eyes:     Conjunctiva/sclera: Conjunctivae normal.  Cardiovascular:     Rate and Rhythm: Normal rate and regular rhythm.     Pulses: Normal pulses.     Heart sounds: Normal heart sounds.  Pulmonary:     Effort: Pulmonary effort is normal.     Breath sounds: Normal breath sounds.  Abdominal:     General: Bowel sounds are normal.     Palpations: Abdomen is soft.  Musculoskeletal:        General: No swelling. Normal range of motion.      Cervical back: Normal range of motion.  Skin:    General: Skin is warm and dry.  Neurological:     General: No focal deficit present.     Mental Status: He is alert and oriented to person, place, and time.  Psychiatric:        Mood and Affect: Mood normal.        Behavior: Behavior normal.        Thought Content: Thought content normal.  Judgment: Judgment normal.     Labs reviewed: Basic Metabolic Panel: Recent Labs    03/15/22 1146 04/03/22 1449 04/03/22 1455 07/27/22 0820  NA 136 139 138 142  K 3.3* 3.6 3.7 4.2  CL 100 100 102 107  CO2 23 25  --  23  GLUCOSE 148* 120* 118* 131*  BUN 14 18 22* 19  CREATININE 1.41* 1.99* 2.20* 1.40*  CALCIUM 9.6 9.9  --  9.5  TSH  --   --   --  3.34   Liver Function Tests: Recent Labs    03/15/22 1314 04/03/22 1449 07/27/22 0820  AST 28 27 15   ALT 28 31 23   ALKPHOS 76 69  --   BILITOT 1.3* 1.4* 0.5  PROT 7.3 7.4 6.5  ALBUMIN 4.3 4.4  --    Recent Labs    03/15/22 1314 04/03/22 1449  LIPASE 32 41   No results for input(s): "AMMONIA" in the last 8760 hours. CBC: Recent Labs    03/15/22 1146 04/03/22 1449 04/03/22 1455 07/27/22 0820  WBC 7.8 5.5  --  6.7  NEUTROABS  --  2.6  --  3,705  HGB 14.3 14.3 15.3 13.6  HCT 45.4 44.9 45.0 43.0  MCV 81.5 80.9  --  81.3  PLT 226 210  --  222   Lipid Panel: Recent Labs    07/27/22 0820  CHOL 142  HDL 70  LDLCALC 56  TRIG 84  CHOLHDL 2.0   Lab Results  Component Value Date   HGBA1C 7.2 (H) 07/27/2022    Procedures since last visit: No results found.  Assessment/Plan  1. Type 2 diabetes mellitus with diabetic nephropathy, without long-term current use of insulin (HCC) Lab Results  Component Value Date   HGBA1C 7.2 (H) 07/27/2022    -  CBGs stable -  declined foot exam -  continue Trulicity, Jardiance and Metformin  2. Essential hypertension -  BP stable -  continue current medications  3. Acquired hypothyroidism Lab Results  Component Value  Date   TSH 3.34 07/27/2022    -  continue Levothyroxine  4. Hyperlipidemia, unspecified hyperlipidemia type Lab Results  Component Value Date   CHOL 142 07/27/2022   HDL 70 07/27/2022   LDLCALC 56 07/27/2022   TRIG 84 07/27/2022   CHOLHDL 2.0 07/27/2022    -  continue Atorvastatin  5. Gastroesophageal reflux disease without esophagitis -  denies heartburns -  continue Omeprazole  6. Chronic constipation -  has regular BMs -  continue Linzess  and Miralax PRN  7. Vitamin D deficiency -  continue Vitamin D supllementation - Vitamin D, 25-hydroxy  8. Stage 3a chronic kidney disease [facility]) Lab Results  Component Value Date   NA 142 07/27/2022   K 4.2 07/27/2022   CO2 23 07/27/2022   GLUCOSE 131 (H) 07/27/2022   BUN 19 07/27/2022   CREATININE 1.40 (H) 07/27/2022   CALCIUM 9.5 07/27/2022   EGFR 59 (L) 07/27/2022   GFRNONAA 38 (L) 04/03/2022    -  discussed avoiding OTC meds -  cut down on Mobic 15 mg every other day from daily  9. Chronic pain of both knees -  continue Mobic 15 mg Q other day  10. Class 2 obesity with body mass index (BMI) of 38.0 to 38.9 in adult, unspecified obesity type, unspecified whether serious comorbidity present Body mass index is 38.57 kg/m. -  current weight 308.6 lbs -  goal is to lose weight and be at least  300 lbs in 6 months -   discussed diet and exercise   Labs/tests ordered:  None  Next appt:  Visit date not found

## 2022-09-10 ENCOUNTER — Other Ambulatory Visit: Payer: Self-pay | Admitting: Cardiology

## 2022-09-11 ENCOUNTER — Other Ambulatory Visit: Payer: Self-pay | Admitting: Orthopedic Surgery

## 2022-09-11 DIAGNOSIS — E559 Vitamin D deficiency, unspecified: Secondary | ICD-10-CM

## 2022-09-11 MED ORDER — VITAMIN D (ERGOCALCIFEROL) 1.25 MG (50000 UNIT) PO CAPS
50000.0000 [IU] | ORAL_CAPSULE | ORAL | 4 refills | Status: DC
Start: 2022-09-11 — End: 2023-04-01

## 2022-10-04 ENCOUNTER — Other Ambulatory Visit (HOSPITAL_COMMUNITY): Payer: Self-pay

## 2022-10-30 ENCOUNTER — Other Ambulatory Visit (HOSPITAL_COMMUNITY): Payer: Self-pay

## 2022-11-09 ENCOUNTER — Other Ambulatory Visit (HOSPITAL_COMMUNITY): Payer: Self-pay

## 2022-11-09 ENCOUNTER — Ambulatory Visit: Payer: BC Managed Care – PPO | Admitting: Nurse Practitioner

## 2022-12-07 ENCOUNTER — Other Ambulatory Visit (HOSPITAL_COMMUNITY): Payer: Self-pay

## 2022-12-20 ENCOUNTER — Encounter: Payer: Self-pay | Admitting: Physician Assistant

## 2022-12-20 NOTE — Progress Notes (Signed)
error 

## 2023-01-14 ENCOUNTER — Other Ambulatory Visit (HOSPITAL_COMMUNITY): Payer: Self-pay

## 2023-01-15 ENCOUNTER — Other Ambulatory Visit (HOSPITAL_COMMUNITY): Payer: Self-pay

## 2023-01-15 MED ORDER — TRULICITY 3 MG/0.5ML ~~LOC~~ SOAJ
3.0000 mg | SUBCUTANEOUS | 2 refills | Status: DC
  Filled 2023-01-15: qty 2, 28d supply, fill #0
  Filled 2023-02-12: qty 2, 28d supply, fill #1
  Filled 2023-03-10: qty 2, 28d supply, fill #2

## 2023-01-18 ENCOUNTER — Other Ambulatory Visit (HOSPITAL_COMMUNITY): Payer: Self-pay

## 2023-01-25 ENCOUNTER — Ambulatory Visit: Payer: BC Managed Care – PPO | Attending: Cardiology | Admitting: Cardiology

## 2023-01-25 ENCOUNTER — Telehealth: Payer: Self-pay | Admitting: *Deleted

## 2023-01-25 VITALS — BP 132/92 | HR 90 | Resp 16 | Ht 75.0 in | Wt 307.8 lb

## 2023-01-25 DIAGNOSIS — I471 Supraventricular tachycardia, unspecified: Secondary | ICD-10-CM

## 2023-01-25 DIAGNOSIS — G4733 Obstructive sleep apnea (adult) (pediatric): Secondary | ICD-10-CM

## 2023-01-25 DIAGNOSIS — R0683 Snoring: Secondary | ICD-10-CM | POA: Diagnosis not present

## 2023-01-25 DIAGNOSIS — I10 Essential (primary) hypertension: Secondary | ICD-10-CM | POA: Diagnosis not present

## 2023-01-25 MED ORDER — AMLODIPINE BESYLATE 10 MG PO TABS: 10.0000 mg | ORAL_TABLET | Freq: Every day | ORAL | 3 refills | Status: AC

## 2023-01-25 NOTE — Patient Instructions (Signed)
Medication Instructions:  Your physician has recommended you make the following change in your medication:   Increase Amlodipine to 10 mg daily  *If you need a refill on your cardiac medications before your next appointment, please call your pharmacy*   Testing/Procedures: Your provider has requested an [place] Sleep Study.   Follow-Up: At [facility], you and your health needs are our priority.  As part of our continuing mission to provide you with exceptional heart care, we have created designated Provider Care Teams.  These Care Teams include your primary Cardiologist (physician) and Advanced Practice Providers (APPs -  Physician Assistants and Nurse Practitioners) who all work together to provide you with the care you need, when you need it.  We recommend signing up for the patient portal called "MyChart".  Sign up information is provided on this After Visit Summary.  MyChart is used to connect with patients for Virtual Visits (Telemedicine).  Patients are able to view lab/test results, encounter notes, upcoming appointments, etc.  Non-urgent messages can be sent to your provider as well.   To learn more about what you can do with MyChart, go to [email or web address].    Your next appointment:   3-4 month(s)  Provider:   Armanda Magic, MD  or Perlie Gold, PA-C

## 2023-01-25 NOTE — Telephone Encounter (Signed)
Walter Gonzales, PAC ORDERED ITAMAR STUDY.   Patient agreement reviewed and signed on 01/25/2023.  WatchPAT issued to patient on 01/25/2023 by Danielle Rankin, CMA. Patient aware to not open the WatchPAT box until contacted with the activation PIN. Patient profile initialized in CloudPAT on 01/25/2023 by Danielle Rankin, CMA. Device serial number: [id number]  Please list Reason for Call as Advice Only and type "WatchPAT issued to patient" in the comment box.

## 2023-01-25 NOTE — Progress Notes (Signed)
Cardiology Office Note:   Date:  01/25/2023  ID:  Walter Gonzales, DOB 1965-04-07, MRN [id number] PCP: Gillis Santa, NP  [facility] Providers Cardiologist:  Armanda Magic, MD    History of Present Illness:   Discussed the use of AI scribe software for clinical note transcription with the patient, who gave verbal consent to proceed.  History of Present Illness   The patient is a 57 year old individual with a complex medical history including hypertension, hypercholesterolemia, supraventricular tachycardia (SVT), diabetes, obesity, hypothyroidism, and gout. The patient was last seen in February, at which time he reported nonexertional chest discomfort and was started on a proton pump inhibitor. A sleep study was also recommended but has not yet been completed.  Over the past several months, the patient has been generally well, but has experienced some issues with blood pressure, which he attributes to stress. He has not been monitoring his blood pressure at home, but reports that readings at his primary care doctor's office have been lower than the elevated reading taken today.  The patient has been experiencing what he believes to be gas-related discomfort in the middle of his chest, which is relieved by burping and seems to be exacerbated by certain foods. He has been taking omeprazole as needed for this issue, but not on a daily basis. He denies any chest tightness, shortness of breath, changes in exertional tolerance, or swelling in the legs.  The patient has noticed very rare episodes of his heart beating faster, upon waking up at night. He wonders if this could be anxiety. He reports snoring but denies any observed apneas. He has expressed a desire to undergo a sleep study to investigate potential sleep apnea. Previous studies ordered but apparently not approved by insurance.   The patient also reports occasional difficulty falling asleep and staying asleep, with episodes of  waking up and using his phone before attempting to sleep again. He denies morning headaches but has experienced neck pain, which he attributes to his sleeping position.  The patient's most recent labs in June showed stable chronic kidney disease and good control of his cholesterol. His HbA1c was 7.2 at that time, but he reports it has since increased to 7.8. He is currently taking Jardiance for his diabetes, levothyroxine for his hypothyroidism, and a daily baby aspirin, which he started on his own. He also reports taking amlodipine for his blood pressure, although this was not initially listed in his medication list.  Despite being on multiple blood pressure medications, the patient's blood pressure remains elevated. He is physically active, engaging in activities such as bowling and carrying items upstairs without difficulty.      Today patient denies chest pain, shortness of breath, lower extremity edema, fatigue, palpitations, melena, hematuria, hemoptysis, diaphoresis, weakness, presyncope, syncope, orthopnea, and PND.   Studies Reviewed:    EKG:        Cardiac Studies & Procedures      ECHOCARDIOGRAM  ECHOCARDIOGRAM COMPLETE 10/05/2016  Narrative ** *[facility]* [address] [place], [place] [address] [phone]  ------------------------------------------------------------------- Transthoracic Echocardiography  Patient:    Walter, Gonzales MR #:       [id number] Study Date: 10/05/2016 Gender:     M Age:        50 Height:     190.5 cm Weight:     161.7 kg BSA:        3 m^2 Pt. Status: Room:       3W15C  ADMITTING  Lyn Records, MD ATTENDING    Lyn Records, MD ORDERING     Lyn Records, MD REFERRING    Lyn Records, MD PERFORMING   [facility], Inpatient SONOGRAPHER  Sinda Du, RDCS  cc:  ------------------------------------------------------------------- LV EF: 45% -    50%  ------------------------------------------------------------------- History:   PMH:  Elevated Troponin Risk factors:  Hypertension. Diabetes mellitus.  ------------------------------------------------------------------- Study Conclusions  - Left ventricle: Systolic function was mildly reduced. The estimated ejection fraction was in the range of 45% to 50%. - Aortic valve: There was trivial regurgitation. - Mitral valve: There was mild regurgitation. - Right ventricle: The cavity size was mildly dilated. Wall thickness was normal.  ------------------------------------------------------------------- Study data:  No prior study was available for comparison.  Study status:  Routine.  Procedure:  Transthoracic echocardiography. Image quality was adequate.  Study completion:  There were no complications.          Transthoracic echocardiography.  M-mode, complete 2D, spectral Doppler, and color Doppler.  Birthdate: Patient birthdate: 1965-06-20.  Age:  Patient is 57 yr old.  Sex: Gender: male.    BMI: 44.5 kg/m^2.  Blood pressure:     159/98 Patient status:  Inpatient.  Study date:  Study date: 10/05/2016. Study time: 02:03 PM.  Location:  Bedside.  -------------------------------------------------------------------  ------------------------------------------------------------------- Left ventricle:   Wall thickness was normal.   Systolic function was mildly reduced. The estimated ejection fraction was in the range of 45% to 50%.  ------------------------------------------------------------------- Aortic valve:   Mildly thickened leaflets.  Doppler:  There was trivial regurgitation.  ------------------------------------------------------------------- Mitral valve:   Structurally normal valve.   Leaflet separation was normal.  Doppler:  Transvalvular velocity was within the normal range. There was no evidence for stenosis. There was  mild regurgitation.  ------------------------------------------------------------------- Left atrium:  The atrium was normal in size.  ------------------------------------------------------------------- Right ventricle:  The cavity size was mildly dilated. Wall thickness was normal. Systolic function was normal.  ------------------------------------------------------------------- Tricuspid valve:   Structurally normal valve.   Leaflet separation was normal.  Doppler:  Transvalvular velocity was within the normal range. There was mild regurgitation.  ------------------------------------------------------------------- Right atrium:  The atrium was normal in size.  ------------------------------------------------------------------- Pericardium:  There was no pericardial effusion.  ------------------------------------------------------------------- Systemic veins: Inferior vena cava: The vessel was dilated. The respirophasic diameter changes were blunted (< 50%), consistent with elevated central venous pressure.  ------------------------------------------------------------------- Measurements  Left ventricle                          Value        Reference LV ID, ED, PLAX chordal         (H)     54    mm     43 - 52 LV ID, ES, PLAX chordal         (H)     41    mm     23 - 38 LV fx shortening, PLAX chordal  (L)     24    %      >=29 LV PW thickness, ED                     11    mm     ---------- IVS/LV PW ratio, ED                     1.18         <=1.3 Stroke  volume, 2D                       56    ml     ---------- Stroke volume/bsa, 2D                   19    ml/m^2 ---------- LV e&', lateral                          10.3  cm/s   ---------- LV e&', medial                           6.09  cm/s   ---------- LV e&', average                          8.2   cm/s   ----------  Ventricular septum                      Value        Reference IVS thickness, ED                       13     mm     ----------  LVOT                                    Value        Reference LVOT ID, S                              23    mm     ---------- LVOT area                               4.15  cm^2   ---------- LVOT peak velocity, S                   66.8  cm/s   ---------- LVOT mean velocity, S                   48.5  cm/s   ---------- LVOT VTI, S                             13.6  cm     ---------- LVOT peak gradient, S                   2     mm Hg  ----------  Aorta                                   Value        Reference Aortic root ID, ED                      27    mm     ----------  Left atrium                             Value  Reference LA ID, A-P, ES                          38    mm     ---------- LA ID/bsa, A-P                          1.27  cm/m^2 <=2.2 LA volume, S                            54    ml     ---------- LA volume/bsa, S                        18    ml/m^2 ---------- LA volume, ES, 1-p A4C                  40.3  ml     ---------- LA volume/bsa, ES, 1-p A4C              13.5  ml/m^2 ---------- LA volume, ES, 1-p A2C                  68.4  ml     ---------- LA volume/bsa, ES, 1-p A2C              22.8  ml/m^2 ----------  Mitral valve                            Value        Reference Mitral deceleration time        (H)     246   ms     150 - 230 Mitral E/A ratio, peak                  2.5          ----------  Right atrium                            Value        Reference RA ID, S-I, ES, A4C             (H)     59.7  mm     34 - 49 RA area, ES, A4C                        17.2  cm^2   8.3 - 19.5 RA volume, ES, A/L                      40.7  ml     ---------- RA volume/bsa, ES, A/L                  13.6  ml/m^2 ----------  Systemic veins                          Value        Reference Estimated CVP                           8     mm Hg  ----------  Right ventricle  Value        Reference RV ID, minor axis, ED, A4C base         44     mm     ---------- TAPSE                                   18.8  mm     ---------- RV s&', lateral, S                       12.7  cm/s   ----------  Legend: (L)  and  (H)  mark values outside specified reference range.  ------------------------------------------------------------------- Prepared and Electronically Authenticated by  Dietrich Pates, M.D. 2018-09-06T14:59:51              Risk Assessment/Calculations:     HYPERTENSION CONTROL Vitals:   01/25/23 1525 01/25/23 1541  BP: (!) 152/100 (!) 132/92    The patient's blood pressure is elevated above target today.  In order to address the patient's elevated BP:       STOP-Bang Score:  7       Physical Exam:   VS:  BP (!) 132/92 (BP Location: Right Arm, Patient Position: Sitting, Cuff Size: Large)   Pulse 90   Resp 16   Ht 6\' 3"  (1.905 m)   Wt (!) 307 lb 12.8 oz (139.6 kg)   SpO2 97%   BMI 38.47 kg/m    Wt Readings from Last 3 Encounters:  01/25/23 (!) 307 lb 12.8 oz (139.6 kg)  09/07/22 (!) 308 lb 9.6 oz (140 kg)  07/19/22 (!) 309 lb 12.8 oz (140.5 kg)     Physical Exam Vitals reviewed.  Constitutional:      Appearance: He is obese.  HENT:     Head: Normocephalic.  Eyes:     Pupils: Pupils are equal, round, and reactive to light.  Cardiovascular:     Rate and Rhythm: Normal rate and regular rhythm.     Pulses: Normal pulses.     Heart sounds: Normal heart sounds.  Pulmonary:     Effort: Pulmonary effort is normal.     Breath sounds: Normal breath sounds.  Abdominal:     General: Abdomen is flat.     Palpations: Abdomen is soft.  Musculoskeletal:     Right lower leg: No edema.     Left lower leg: No edema.  Skin:    General: Skin is warm and dry.     Capillary Refill: Capillary refill takes less than 2 seconds.  Neurological:     General: No focal deficit present.     Mental Status: He is alert and oriented to person, place, and time.  Psychiatric:        Mood and Affect: Mood normal.         Behavior: Behavior normal.        Thought Content: Thought content normal.        Judgment: Judgment normal.     ASSESSMENT AND PLAN:     Assessment and Plan    Hypertension Elevated blood pressure readings today in clinic. On carvedilol, olmesartan, hydrochlorothiazide, and amlodipine. Amlodipine was previously omitted from his med list. Discussed importance of managing blood pressure to prevent stroke and heart disease. Untreated sleep apnea may contribute to hypertension. - Increase amlodipine to 10 mg daily - Continue Coreg 25mg  BID, Benicar 40-12.5mg  - Recommend obtaining a  blood pressure cuff and tracking daily blood pressures - Refer to hypertension PharmD clinic for further management given resistant hypertension and CKD.  - Set up sleep study to evaluate for sleep apnea  Sleep Apnea (suspected) Reports nocturnal awakenings with tachycardia, snoring, and daytime sleepiness. Previous sleep study not completed due to insurance issues. Untreated sleep apnea is a significant risk factor for hypertension and cardiovascular disease. Stop-Bang 7, Epworth 10 - Reorder sleep study  Diabetes Mellitus Type 2 A1c increased from 7.2% in June to 7.8%. Currently on Jardiance, Metformin, Trulicity. Discussed importance of glycemic control to prevent complications such as neuropathy, retinopathy, and cardiovascular disease. - Continue follow up with PCP  Hypercholesterolemia Cholesterol levels well-controlled with LDL at 50 mg/dL. Currently on atorvastatin 40 mg daily. Discussed importance of maintaining LDL levels to reduce cardiovascular risk. - Continue atorvastatin 40 mg daily  SVT Patient with remote history of PVCs. Nocturnal palpitations are infrequent and not consistent with SVT. If patient feels frequency is increasing and sleep study is unrevealing, would consider Zio.   Chronic Kidney Disease Mild elevation in creatinine noted in June, 1.4, consistent with previous levels.  Discussed importance of monitoring kidney function to prevent disease progression. - Monitor kidney function annually  Gastroesophageal Reflux Disease (GERD) Nonexertional chest discomfort relieved by burping, associated with certain foods. Currently taking omeprazole as needed. Omeprazole is most effective when taken daily on an empty stomach. Discussed risks of daily aspirin including increased bleeding risk and GI irritation. - Instruct to take omeprazole daily on an empty stomach - Discontinue daily aspirin due to potential GI irritation  Hypothyroidism Currently on levothyroxine. Discussed importance of taking levothyroxine on an empty stomach in the morning for optimal absorption. - Continue levothyroxine with management by PCP  Follow-up - Follow up in 4 months with me or Dr. Mayford Knife - Reassess blood pressure and other conditions at follow-up.                 Signed, Perlie Gold, PA-C

## 2023-03-08 ENCOUNTER — Ambulatory Visit: Payer: BC Managed Care – PPO | Admitting: Adult Health

## 2023-03-10 ENCOUNTER — Other Ambulatory Visit (HOSPITAL_COMMUNITY): Payer: Self-pay

## 2023-03-12 ENCOUNTER — Other Ambulatory Visit: Payer: Self-pay | Admitting: Cardiology

## 2023-03-15 ENCOUNTER — Ambulatory Visit: Payer: BC Managed Care – PPO | Attending: Cardiology | Admitting: Pharmacist

## 2023-03-15 ENCOUNTER — Ambulatory Visit: Payer: BC Managed Care – PPO | Admitting: Adult Health

## 2023-03-15 VITALS — BP 142/92 | HR 96

## 2023-03-15 DIAGNOSIS — I10 Essential (primary) hypertension: Secondary | ICD-10-CM | POA: Diagnosis not present

## 2023-03-15 DIAGNOSIS — E1121 Type 2 diabetes mellitus with diabetic nephropathy: Secondary | ICD-10-CM | POA: Diagnosis not present

## 2023-03-15 NOTE — Progress Notes (Signed)
Patient ID: Walter Gonzales                 DOB: 1965/03/18                      MRN: [id number]      HPI: Walter Gonzales is a 58 y.o. male referred by Perlie Gold to HTN clinic. PMH is significant for hypertension, hypercholesterolemia, supraventricular tachycardia (SVT), diabetes, obesity, hypothyroidism, and gout.   Patient seen by Clayburn Pert on 01/25/2023.  Blood pressure in clinic was 152/100 improved to 132/92 on repeat.  Patient reported difficulty with sleep and anxiety.  He was referred for sleep study.  Amlodipine was increased to 10 mg daily  Patient presents today to hypertension clinic.  He has not taken any of his medications this morning yet.  He reports compliance with his medications only missing a dose once every few months.  He denies any dizziness, lightheadedness, headaches (unless he sleeps wrong and has neck pain), swelling or shortness of breath.  He has been on Trulicity, has consider changing to Ozempic but decided to stay on Trulicity.  Did not tolerate the 4.5 mg dose.  Does feel as though his appetite is been reduced but he still snacks out of boredom on potato chips and drinks.  He knows that he needs to do better.  We talked about how drinking sugary drinks did not directly increase his blood pressure but do tend to lead to excess calories that leads to weight gain which can then lead to increased blood pressure.  We also discussed the effects on his blood sugar.  He does not do much regular exercise, but plans to get back to the gym where he plans to do some cardio (either walking the Trail or riding the bike) and some light weightlifting.  We talked about setting small goals and getting back into regular exercise.  He has not yet heard about the pin for his sleep study.  I will reach out to [facility] for follow-up on this.  He has not been checking his blood pressure at home but does report that he has a blood pressure cuff that goes on his upper arm. He has lost about  70lb.  Current HTN meds: Amlodipine 10 mg daily, carvedilol 25 mg twice a day, olmesartan/hydrochlorothiazide 40/12.5 mg daily Previously tried: Losartan, metoprolol BP goal: Less than 130/80  Family History:  Family History  Problem Relation Age of Onset   Hypertension Mother    Diabetes Mother    Heart attack Mother    Pancreatic cancer Mother    Heart attack Father    Heart failure Maternal Grandmother    Diabetes Maternal Grandmother    High blood pressure Maternal Grandmother    High Cholesterol Maternal Grandmother    Esophageal cancer Neg Hx    Liver disease Neg Hx    Colon cancer Neg Hx      Social History:  Social History   Socioeconomic History   Marital status: Married    Spouse name: Not on file   Number of children: 1   Years of education: Not on file   Highest education level: Not on file  Occupational History   Not on file  Tobacco Use   Smoking status: Never   Smokeless tobacco: Never  Vaping Use   Vaping status: Never Used  Substance and Sexual Activity   Alcohol use: Yes    Comment: 1-2 year   Drug use: No  Sexual activity: Yes  Other Topics Concern   Not on file  Social History Narrative   Lives in [place]    Mental health care worker      Tobacco use, amount per day now: N/A   Past tobacco use, amount per day: N/A   How many years did you use tobacco: N/A   Alcohol use (drinks per week):   Diet:   Do you drink/eat things with caffeine: Yes soda, and coffee   Marital status:  Married                                 What year were you married? 2003   Do you live in a house, apartment, assisted living, condo, trailer, etc.? House    Is it one or more stories? Two stories.   How many persons live in your home? 2   Do you have pets in your home?( please list) No   Highest Level of education completed? Bachelors Degree   Current or past profession: Mental Health   Do you exercise?  Some                                Type and how  often? Walking, [employer].   Do you have a living will? No   Do you have a DNR form?  No                                 If not, do you want to discuss one?   Do you have signed POA/HPOA forms?No                        If so, please bring to you appointment      Do you have any difficulty bathing or dressing yourself? No   Do you have any difficulty preparing food or eating? No   Do you have any difficulty managing your medications? No   Do you have any difficulty managing your finances? No   Do you have any difficulty affording your medications? No   Social Drivers of Corporate investment banker Strain: Not on file  Food Insecurity: Not on file  Transportation Needs: Not on file  Physical Activity: Not on file  Stress: Not on file  Social Connections: Not on file  Intimate Partner Violence: Not on file    Diet: 1-2 soda, rarely uses salt, eats potato chips  Exercise:  Bowl several times a week, getting ready to go back to gym, plans to light weight lifting, swimming, walking Has a bike at home that he rides sometimes  Home BP readings:  Not checking at home but does have a cuff  Wt Readings from Last 3 Encounters:  01/25/23 (!) 307 lb 12.8 oz (139.6 kg)  09/07/22 (!) 308 lb 9.6 oz (140 kg)  07/19/22 (!) 309 lb 12.8 oz (140.5 kg)   BP Readings from Last 3 Encounters:  03/15/23 (!) 142/92  01/25/23 (!) 132/92  09/07/22 128/88   Pulse Readings from Last 3 Encounters:  03/15/23 96  01/25/23 90  09/07/22 81    Renal function: CrCl cannot be calculated (Patient's most recent lab result is older than the maximum 21 days allowed.).  Past Medical History:  Diagnosis Date  Diabetes mellitus without complication (HCC)    Gout    Hypercholesterolemia    Hypertension    Morbid obesity (HCC)    SVT (supraventricular tachycardia)    adenosine sensitive short RP SVT   Thyroid disease    Hypothyroidism    Current Outpatient Medications on File Prior to Visit  Medication  Sig Dispense Refill   amLODipine (NORVASC) 10 MG tablet Take 1 tablet (10 mg total) by mouth daily. 90 tablet 3   atorvastatin (LIPITOR) 40 MG tablet Take 1 tablet by mouth once daily for 30 days for 90 days     B Complex-C-Folic Acid (HM SUPER VITAMIN B COMPLEX/C PO) Take by mouth.     carvedilol (COREG) 25 MG tablet Take 1 tablet by mouth twice daily 180 tablet 3   Cinnamon 500 MG TABS Take 2,000 mg by mouth daily.     Dulaglutide (TRULICITY) 3 MG/0.5ML SOAJ Inject 3 mg into the skin once a week. 2 mL 2   empagliflozin (JARDIANCE) 10 MG TABS tablet Take 10 mg by mouth daily.     levothyroxine (SYNTHROID, LEVOTHROID) 100 MCG tablet Take 100 mcg by mouth daily before breakfast.     linaclotide (LINZESS) 290 MCG CAPS capsule Take 290 mcg by mouth every other day.     meloxicam (MOBIC) 15 MG tablet Take 15 mg by mouth every other day. Take with food     metFORMIN (GLUCOPHAGE) 1000 MG tablet Take 1,000 mg by mouth 2 (two) times daily.     olmesartan-hydrochlorothiazide (BENICAR HCT) 40-12.5 MG tablet Take 1 tablet by mouth daily. 90 tablet 3   omeprazole (PRILOSEC) 40 MG capsule TAKE 1 CAPSULE BY MOUTH 30 MINUTES BEFORE MORNING MEAL ONCE DAILY     polyethylene glycol (MIRALAX / GLYCOLAX) 17 g packet Take 17 g by mouth daily as needed.     traMADol (ULTRAM) 50 MG tablet Take 50 mg by mouth daily as needed (pain).      Turmeric (QC TUMERIC COMPLEX) 500 MG CAPS Take by mouth.     Vitamin D, Ergocalciferol, (DRISDOL) 1.25 MG (50000 UNIT) CAPS capsule Take 1 capsule (50,000 Units total) by mouth once a week. 5 capsule 4   zinc gluconate 50 MG tablet Take 50 mg by mouth daily.     indomethacin (INDOCIN) 50 MG capsule 1 capsule with food or milk Orally Twice a day prn for 30 days (Patient not taking: Reported on 03/15/2023)     No current facility-administered medications on file prior to visit.    No Known Allergies  Blood pressure (!) 142/92, pulse 96.   Assessment/Plan: HYPERTENSION  CONTROL Vitals:   03/15/23 0858 03/15/23 0906  BP: (!) 156/92 (!) 142/92    The patient's blood pressure is elevated above target today.  In order to address the patient's elevated BP: Blood pressure will be monitored at home to determine if medication changes need to be made.; A referral to the PharmD Hypertension Clinic will be placed.      1. Hypertension -  Essential hypertension Assessment: Blood pressure above goal less than 130/80 in clinic today Blood pressure improved on recheck but still above goal No home readings available Patient did not take his blood pressure medications this morning yet We discussed checking blood pressure at home 1-2 times per day Reviewed proper way to check blood pressure Encouraged him to start regular exercise.  Discussed setting small goals such as going to the gym 3 times per week We discussed replacing soda with  flavored sparkling water We discussed cutting back on potato chips  Plan: Continue amlodipine 10 mg daily, carvedilol 25 mg twice a day, olmesartan/hydrochlorothiazide 40/12.5 mg daily I have asked patient to check his blood pressure at home 1-2 times daily at least a few hours after his medications.  Record these readings and bring log and home blood pressure machine with him to next appointment Increase physical activity Cut back on soda and potato chips Follow-up in 5 weeks    Thank you  Olene Floss, Pharm.D, BCACP, CPP [facility] [address] [place], [place] [address]  Phone: [phone]; Fax: [phone]

## 2023-03-15 NOTE — Progress Notes (Signed)
I have not yet heard from  the sleep coordinator if approved. I will send them a message to check if approved.

## 2023-03-15 NOTE — Assessment & Plan Note (Signed)
Assessment: Blood pressure above goal less than 130/80 in clinic today Blood pressure improved on recheck but still above goal No home readings available Patient did not take his blood pressure medications this morning yet We discussed checking blood pressure at home 1-2 times per day Reviewed proper way to check blood pressure Encouraged him to start regular exercise.  Discussed setting small goals such as going to the gym 3 times per week We discussed replacing soda with flavored sparkling water We discussed cutting back on potato chips  Plan: Continue amlodipine 10 mg daily, carvedilol 25 mg twice a day, olmesartan/hydrochlorothiazide 40/12.5 mg daily I have asked patient to check his blood pressure at home 1-2 times daily at least a few hours after his medications.  Record these readings and bring log and home blood pressure machine with him to next appointment Increase physical activity Cut back on soda and potato chips Follow-up in 5 weeks

## 2023-03-15 NOTE — Patient Instructions (Addendum)
Your blood pressure goal is < 130/39mmHg   Continue amlodipine 10 mg daily, carvedilol 25 mg twice a day, olmesartan/hydrochlorothiazide 40/12.5 mg daily  Start checking blood pressure at home once or twice a day Please bring in your list of blood pressure readings and machine to your next visit Try to increase your exercise  Try substituting your soda for [facility] or other sparkling water   Important lifestyle changes to control high blood pressure  Intervention  Effect on the BP   Weight loss Weight loss is one of the most effective lifestyle changes for controlling blood pressure. If you're overweight or obese, losing even a small amount of weight can help reduce blood pressure.    Blood pressure can decrease by 1 millimeter of mercury (mmHg) with each kilogram (about 2.2 pounds) of weight lost.   Exercise regularly As a general goal, aim for 30 minutes of moderate physical activity every day.    Regular physical activity can lower blood pressure by 5 - 8 mmHg.   Eat a healthy diet Eat a diet rich in whole grains, fruits, vegetables, lean meat, and low-fat dairy products. Limit processed foods, saturated fat, and sweets.    A heart-healthy diet can lower high blood pressure by 10 mmHg.   Reduce salt (sodium) in your diet Aim for 000mg  of sodium each day. Avoid deli meats, canned food, and frozen microwave meals which are high in sodium.     Limiting sodium can reduce blood pressure by 5 mmHg.   Limit alcohol One drink equals 12 ounces of beer, 5 ounces of wine, or 1.5 ounces of 80-proof liquor.    Limiting alcohol to < 1 drink a day for women or < 2 drinks a day for men can help lower blood pressure by about 4 mmHg.   To check your pressure at home you will need to:   Sit up in a chair, with feet flat on the floor and back supported. Do not cross your ankles or legs. Rest your left arm so that the cuff is about heart level. If the cuff goes on your upper arm, then  just relax your arm on the table, arm of the chair, or your lap. If you have a wrist cuff, hold your wrist against your chest at heart level. Place the cuff snugly around your arm, about 1 inch above the crease of your elbow. The cords should be inside the groove of your elbow.  Sit quietly, with the cuff in place, for about 5 minutes. Then press the power button to start a reading. Do not talk or move while the reading is taking place.  Record your readings on a sheet of paper. Although most cuffs have a memory, it is often easier to see a pattern developing when the numbers are all in front of you.  You can repeat the reading after 1-3 minutes if it is recommended.   Make sure your bladder is empty and you have not had caffeine or tobacco within the last 30 minutes   Always bring your blood pressure log with you to your appointments. If you have not brought your monitor in to be double checked for accuracy, please bring it to your next appointment.   You can find a list of validated (accurate) blood pressure cuffs at: validatebp.org

## 2023-03-19 ENCOUNTER — Telehealth: Payer: Self-pay

## 2023-03-19 NOTE — Telephone Encounter (Signed)
 Ordering provider: Mayford Knife Associated diagnoses: Loud Snoring WatchPAT PA obtained on 03/19/2023 by Brunetta Genera, LPN. Authorization: Yes; tracking ID [id number] Patient notified of PIN ([id number]) on 03/19/2023 via Notification Method: phone.

## 2023-03-19 NOTE — Telephone Encounter (Signed)
-----   Message from [facility] Walter Gonzales F sent at 03/15/2023  9:53 AM EST -----    ----- Message ----- From: Olene Floss, RPH-CPP Sent: 03/15/2023   9:33 AM EST To: Tarri Fuller, CMA  Patient was asking about an update on his sleep study. Looks like we are waiting on a PIN?

## 2023-03-27 NOTE — Telephone Encounter (Signed)
 I s/w the pt about sleep study Itamar. Pt said he will do the study by this weekend. I stated that sleep coordinator sent him a my chart message with the approval and the PIN# [id number]. Pt thanked me for the call and the help.

## 2023-03-27 NOTE — Progress Notes (Signed)
 See message sent to [facility] CHART ok to proceed and PIN# provided. Looks like the pt has not read his MY CHART message. I will call him as well.   Thank you Okey Regal      March 19, 2023 Brunetta Genera, LPN to [facility]      03/19/23 11:02 AM Good morning Walter Gonzales. The prior authorization for your [facility] Sleep Test has been approved. You ,may complete you Homes sleep study at your earliest convenience. The PIN for your device is [id number]. If you have any issues, questions, or concerns, please reach out to me at [phone]  This MyChart message has not been read.

## 2023-03-27 NOTE — Progress Notes (Signed)
 March 27, 2023 Me     03/27/23  8:43 AM Note I s/w the pt about sleep study Walter Gonzales. Pt said he will do the study by this weekend. I stated that sleep coordinator sent him a my chart message with the approval and the PIN# [id number]. Pt thanked me for the call and the help.

## 2023-03-29 ENCOUNTER — Encounter: Payer: Self-pay | Admitting: Adult Health

## 2023-03-29 ENCOUNTER — Ambulatory Visit: Payer: BC Managed Care – PPO | Admitting: Adult Health

## 2023-03-29 VITALS — BP 135/87 | HR 94 | Temp 96.6°F | Resp 18 | Ht 75.0 in | Wt 310.2 lb

## 2023-03-29 DIAGNOSIS — Z5181 Encounter for therapeutic drug level monitoring: Secondary | ICD-10-CM

## 2023-03-29 DIAGNOSIS — I10 Essential (primary) hypertension: Secondary | ICD-10-CM | POA: Diagnosis not present

## 2023-03-29 DIAGNOSIS — E785 Hyperlipidemia, unspecified: Secondary | ICD-10-CM

## 2023-03-29 DIAGNOSIS — E1121 Type 2 diabetes mellitus with diabetic nephropathy: Secondary | ICD-10-CM | POA: Diagnosis not present

## 2023-03-29 DIAGNOSIS — E559 Vitamin D deficiency, unspecified: Secondary | ICD-10-CM

## 2023-03-29 DIAGNOSIS — M170 Bilateral primary osteoarthritis of knee: Secondary | ICD-10-CM

## 2023-03-29 DIAGNOSIS — M1A9XX0 Chronic gout, unspecified, without tophus (tophi): Secondary | ICD-10-CM | POA: Diagnosis not present

## 2023-03-29 DIAGNOSIS — E039 Hypothyroidism, unspecified: Secondary | ICD-10-CM

## 2023-03-29 DIAGNOSIS — B351 Tinea unguium: Secondary | ICD-10-CM | POA: Diagnosis not present

## 2023-03-29 DIAGNOSIS — K5909 Other constipation: Secondary | ICD-10-CM

## 2023-03-29 DIAGNOSIS — K219 Gastro-esophageal reflux disease without esophagitis: Secondary | ICD-10-CM

## 2023-03-29 MED ORDER — INDOMETHACIN 50 MG PO CAPS: 50.0000 mg | ORAL_CAPSULE | Freq: Every day | ORAL | Status: DC | PRN

## 2023-03-29 NOTE — Progress Notes (Signed)
 [facility] clinic  Provider:  Kenard Gower DNP  Code Status:  Full Code  Goals of Care:     09/07/2022    8:34 AM  Advanced Directives  Does Patient Have a Medical Advance Directive? No  Would patient like information on creating a medical advance directive? No - Patient declined     Chief Complaint  Patient presents with   Medical Management of Chronic Issues     6 months follow-up   Immunizations    Pneumonia, and DTAP    Health Maintenance    Foot Exam   Labs Only    Hemoglobin A1C and Diabetic kidney evaluation-Urine ACR (orders are pending)    Discussed the use of AI scribe software for clinical note transcription with the patient, who gave verbal consent to proceed.  HPI: Patient is a 58 y.o. male seen today for a 27-month follow up of chronic medical issues.  He has diabetes and monitors his blood sugar at home, with recent readings of 128 mg/dL and a high of 657 mg/dL. He takes Trulicity 3 mg weekly, Jardiance 10 mg daily, and Metformin 1000 mg twice daily.  He has hypertension managed with carvedilol 25 mg twice daily, olmesartan/hydrochlorothiazide 40/12.5 mg daily, and amlodipine 10 mg daily.  He reports issues with his toenails, describing them as thick and 'ugly', with one nail having come off but sometimes growing back. He has not seen a podiatrist recently.  He experiences occasional gout flare-ups but notes it has been a while since the last one. He takes Indocin 50 mg as needed for gout.  He has hypothyroidism and takes levothyroxine 100 mcg daily. His last TSH was within normal limits in June.  He has osteoarthritis in both knees, which flares up with cold weather. He takes meloxicam as needed and occasionally uses tramadol for pain, particularly when bowling. He takes turmeric for knee pain.  He experiences constipation occasionally, which he attributes to insufficient water intake. He tries to drink four bottles of water a day but often only manages  two. He takes Linzess every other day.  He has a history of vitamin D deficiency and takes 50,000 IU once a week. He also takes zinc 50 mg daily for supplementation.     Past Medical History:  Diagnosis Date   Diabetes mellitus without complication (HCC)    Gout    Hypercholesterolemia    Hypertension    Morbid obesity (HCC)    SVT (supraventricular tachycardia) (HCC)    adenosine sensitive short RP SVT   Thyroid disease    Hypothyroidism    Past Surgical History:  Procedure Laterality Date   ACHILLES TENDON REPAIR Right     No Known Allergies  Outpatient Encounter Medications as of 03/29/2023  Medication Sig   amLODipine (NORVASC) 10 MG tablet Take 1 tablet (10 mg total) by mouth daily.   atorvastatin (LIPITOR) 40 MG tablet Take 1 tablet by mouth once daily for 30 days for 90 days   B Complex-C-Folic Acid (HM SUPER VITAMIN B COMPLEX/C PO) Take by mouth.   carvedilol (COREG) 25 MG tablet Take 1 tablet by mouth twice daily   Cinnamon 500 MG TABS Take 2,000 mg by mouth daily.   Dulaglutide (TRULICITY) 3 MG/0.5ML SOAJ Inject 3 mg into the skin once a week.   empagliflozin (JARDIANCE) 10 MG TABS tablet Take 10 mg by mouth daily.   levothyroxine (SYNTHROID, LEVOTHROID) 100 MCG tablet Take 100 mcg by mouth daily before breakfast.   linaclotide (  LINZESS) 290 MCG CAPS capsule Take 290 mcg by mouth every other day.   meloxicam (MOBIC) 15 MG tablet Take 15 mg by mouth every other day. Take with food   metFORMIN (GLUCOPHAGE) 1000 MG tablet Take 1,000 mg by mouth 2 (two) times daily.   olmesartan-hydrochlorothiazide (BENICAR HCT) 40-12.5 MG tablet Take 1 tablet by mouth daily.   omeprazole (PRILOSEC) 40 MG capsule TAKE 1 CAPSULE BY MOUTH 30 MINUTES BEFORE MORNING MEAL ONCE DAILY   polyethylene glycol (MIRALAX / GLYCOLAX) 17 g packet Take 17 g by mouth daily as needed.   traMADol (ULTRAM) 50 MG tablet Take 50 mg by mouth daily as needed (pain).    Turmeric (QC TUMERIC COMPLEX) 500 MG  CAPS Take by mouth.   Vitamin D, Ergocalciferol, (DRISDOL) 1.25 MG (50000 UNIT) CAPS capsule Take 1 capsule (50,000 Units total) by mouth once a week.   zinc gluconate 50 MG tablet Take 50 mg by mouth daily.   indomethacin (INDOCIN) 50 MG capsule 1 capsule with food or milk Orally Twice a day prn for 30 days (Patient not taking: Reported on 03/29/2023)   No facility-administered encounter medications on file as of 03/29/2023.    Review of Systems:  Review of Systems  Constitutional:  Negative for activity change, appetite change and fever.  HENT:  Negative for sore throat.   Eyes: Negative.   Cardiovascular:  Negative for chest pain and leg swelling.  Gastrointestinal:  Negative for abdominal distention, diarrhea and vomiting.  Genitourinary:  Negative for dysuria, frequency and urgency.  Skin:  Negative for color change.  Neurological:  Negative for dizziness and headaches.  Psychiatric/Behavioral:  Negative for behavioral problems and sleep disturbance. The patient is not nervous/anxious.     Health Maintenance  Topic Date Due   Pneumococcal Vaccine 90-59 Years old (1 of 2 - PCV) Never done   Diabetic kidney evaluation - Urine ACR  Never done   DTaP/Tdap/Td (1 - Tdap) Never done   INFLUENZA VACCINE  08/31/2022   HEMOGLOBIN A1C  01/26/2023   OPHTHALMOLOGY EXAM  04/03/2023   Zoster Vaccines- Shingrix (2 of 2) 04/19/2023   Diabetic kidney evaluation - eGFR measurement  07/27/2023   FOOT EXAM  03/28/2024   Colonoscopy  01/19/2029   COVID-19 Vaccine  Completed   Hepatitis C Screening  Completed   HIV Screening  Completed   HPV VACCINES  Aged Out    Physical Exam: Vitals:   03/29/23 0844  BP: 135/87  Pulse: 94  Resp: 18  Temp: (!) 96.6 F (35.9 C)  SpO2: 97%  Weight: (!) 310 lb 3.2 oz (140.7 kg)  Height: 6\' 3"  (1.905 m)   Body mass index is 38.77 kg/m. Physical Exam Constitutional:      Appearance: He is obese.  HENT:     Head: Normocephalic and atraumatic.      Mouth/Throat:     Mouth: Mucous membranes are moist.  Eyes:     Conjunctiva/sclera: Conjunctivae normal.  Cardiovascular:     Rate and Rhythm: Normal rate and regular rhythm.     Pulses: Normal pulses.     Heart sounds: Normal heart sounds.  Pulmonary:     Effort: Pulmonary effort is normal.     Breath sounds: Normal breath sounds.  Abdominal:     General: Bowel sounds are normal.     Palpations: Abdomen is soft.  Musculoskeletal:        General: No swelling. Normal range of motion.     Cervical back:  Normal range of motion.  Skin:    General: Skin is warm and dry.  Neurological:     General: No focal deficit present.     Mental Status: He is alert and oriented to person, place, and time.  Psychiatric:        Mood and Affect: Mood normal.        Behavior: Behavior normal.        Thought Content: Thought content normal.        Judgment: Judgment normal.     Labs reviewed: Basic Metabolic Panel: Recent Labs    04/03/22 1449 04/03/22 1455 07/27/22 0820  NA 139 138 142  K 3.6 3.7 4.2  CL 100 102 107  CO2 25  --  23  GLUCOSE 120* 118* 131*  BUN 18 22* 19  CREATININE 1.99* 2.20* 1.40*  CALCIUM 9.9  --  9.5  TSH  --   --  3.34   Liver Function Tests: Recent Labs    04/03/22 1449 07/27/22 0820  AST 27 15  ALT 31 23  ALKPHOS 69  --   BILITOT 1.4* 0.5  PROT 7.4 6.5  ALBUMIN 4.4  --    Recent Labs    04/03/22 1449  LIPASE 41   No results for input(s): "AMMONIA" in the last 8760 hours. CBC: Recent Labs    04/03/22 1449 04/03/22 1455 07/27/22 0820  WBC 5.5  --  6.7  NEUTROABS 2.6  --  3,705  HGB 14.3 15.3 13.6  HCT 44.9 45.0 43.0  MCV 80.9  --  81.3  PLT 210  --  222   Lipid Panel: Recent Labs    07/27/22 0820  CHOL 142  HDL 70  LDLCALC 56  TRIG 84  CHOLHDL 2.0   Lab Results  Component Value Date   HGBA1C 7.2 (H) 07/27/2022    Procedures since last visit: No results found.  Assessment/Plan  1. Onychomycosis (Primary) -  Thickened  nails with no wounds or loss of sensation. Good peripheral pulses indicating good circulation. -Refer to podiatry for further management. - Ambulatory referral to Podiatry  2. Type 2 diabetes mellitus with diabetic nephropathy, without long-term current use of insulin (HCC) -  Patient reports home blood sugars ranging from 128-166. Currently on Trulicity 3mg  weekly, Jardiance 10mg  daily, and Metformin 1000mg  twice daily. -Continue current regimen. -Check HbA1c and urine microalbumin today. - Urine Albumin/Creatinine with ratio (send out) [LAB689] - Hemoglobin A1c - CBC with Differential/Platelets  3. Essential hypertension -  Patient on Carvedilol 25mg  twice daily, Olmesartan/Hydrochlorothiazide 40/12.5mg  daily, and Amlodipine 10mg  daily. -Continue current regimen. - Basic Metabolic Panel with eGFR  4. Chronic gout without tophus, unspecified cause, unspecified site -  stable - indomethacin (INDOCIN) 50 MG capsule; Take 1 capsule (50 mg total) by mouth daily as needed.  5. Bilateral primary osteoarthritis of knee -  Bilateral knee pain, worse with cold weather. Patient on Meloxicam 15mg  every other day as needed. -Continue Meloxicam as needed and Tramadol 50 mg daily PRN -Advised against concurrent use of Meloxicam and Motrin due to risk of gastritis.  6. Chronic constipation -  Patient reports occasional constipation, likely due to inadequate water intake. Currently taking Linzess 290 mcg every other day -Encourage increased water intake.  7. Vitamin D deficiency -  Patient on Vitamin D 50,000 units once weekly. -Check Vitamin D level today. - Vitamin D, 25-hydroxy  8. Hyperlipidemia, unspecified hyperlipidemia type Lab Results  Component Value Date   CHOL 142 07/27/2022  HDL 70 07/27/2022   LDLCALC 56 07/27/2022   TRIG 84 07/27/2022   CHOLHDL 2.0 07/27/2022    -  continue Atorvastatin 40 mg daily  9. Acquired hypothyroidism -  continue Levothyroxine 100 mcg daily -  TSH  10. Gastroesophageal reflux disease without esophagitis -  Patient reports no current issues. On Omeprazole 40mg  daily. -Continue current regimen.  11. Morbid obesity (HCC) -  counseled on diet and exercise  12. Therapeutic drug monitoring -  Patient takes Tramadol as needed, approximately 30 pills in 90 days, primarily for pain associated with bowling. -Continue Tramadol as needed. -Establish pain management contract and perform urine drug screen. -  DRUG MONITORING, PANEL 6 WITH CONFIRMATION, URINE       General Health Maintenance -Ensure up-to-date with vaccinations, including tetanus and pneumonia. -Plan follow-up in 6 months, or sooner if any significant findings on today's lab work.    Labs/tests ordered:  BMP, CBC, urine drug monitoring panel, tsh, urine albumin/creatinine ratio, tsh, A1C   Jameon Deller Medina-Vargas, NP

## 2023-03-30 LAB — CBC WITH DIFFERENTIAL/PLATELET
Absolute Lymphocytes: 1965 {cells}/uL (ref 850–3900)
Absolute Monocytes: 480 {cells}/uL (ref 200–950)
Basophils Absolute: 32 {cells}/uL (ref 0–200)
Basophils Relative: 0.5 %
Eosinophils Absolute: 166 {cells}/uL (ref 15–500)
Eosinophils Relative: 2.6 %
HCT: 45.9 % (ref 38.5–50.0)
Hemoglobin: 14.2 g/dL (ref 13.2–17.1)
MCH: 25.4 pg — ABNORMAL LOW (ref 27.0–33.0)
MCHC: 30.9 g/dL — ABNORMAL LOW (ref 32.0–36.0)
MCV: 82.3 fL (ref 80.0–100.0)
MPV: 10.8 fL (ref 7.5–12.5)
Monocytes Relative: 7.5 %
Neutro Abs: 3757 {cells}/uL (ref 1500–7800)
Neutrophils Relative %: 58.7 %
Platelets: 247 10*3/uL (ref 140–400)
RBC: 5.58 10*6/uL (ref 4.20–5.80)
RDW: 14.5 % (ref 11.0–15.0)
Total Lymphocyte: 30.7 %
WBC: 6.4 10*3/uL (ref 3.8–10.8)

## 2023-03-30 LAB — DRUG MONITORING, PANEL 6 WITH CONFIRMATION, URINE
6 Acetylmorphine: NEGATIVE ng/mL (ref <10)
Alcohol Metabolites: NEGATIVE ng/mL (ref <500)
Amphetamines: NEGATIVE ng/mL (ref <500)
Barbiturates: NEGATIVE ng/mL (ref <300)
Benzodiazepines: NEGATIVE ng/mL (ref <100)
Cocaine Metabolite: NEGATIVE ng/mL (ref <150)
Creatinine: 94.3 mg/dL (ref >=20.0)
Marijuana Metabolite: NEGATIVE ng/mL (ref <20)
Methadone Metabolite: NEGATIVE ng/mL (ref <100)
Opiates: NEGATIVE ng/mL (ref <100)
Oxidant: NEGATIVE ug/mL (ref <200)
Oxycodone: NEGATIVE ng/mL (ref <100)
Phencyclidine: NEGATIVE ng/mL (ref <25)
pH: 5 (ref 4.5–9.0)

## 2023-03-30 LAB — HEMOGLOBIN A1C
Hgb A1c MFr Bld: 7.9 %{Hb} — ABNORMAL HIGH (ref <5.7)
Mean Plasma Glucose: 180 mg/dL
eAG (mmol/L): 10 mmol/L

## 2023-03-30 LAB — MICROALBUMIN / CREATININE URINE RATIO
Creatinine, Urine: 102 mg/dL (ref 20–320)
Microalb Creat Ratio: 10 mg/g{creat} (ref <30)
Microalb, Ur: 1 mg/dL

## 2023-03-30 LAB — BASIC METABOLIC PANEL WITH GFR
BUN/Creatinine Ratio: 16 (calc) (ref 6–22)
BUN: 21 mg/dL (ref 7–25)
CO2: 26 mmol/L (ref 20–32)
Calcium: 10.2 mg/dL (ref 8.6–10.3)
Chloride: 105 mmol/L (ref 98–110)
Creat: 1.32 mg/dL — ABNORMAL HIGH (ref 0.70–1.30)
Glucose, Bld: 123 mg/dL (ref 65–139)
Potassium: 4.1 mmol/L (ref 3.5–5.3)
Sodium: 141 mmol/L (ref 135–146)
eGFR: 63 mL/min/{1.73_m2} (ref >=60)

## 2023-03-30 LAB — DM TEMPLATE

## 2023-03-30 LAB — VITAMIN D 25 HYDROXY (VIT D DEFICIENCY, FRACTURES): Vit D, 25-Hydroxy: 112 ng/mL — ABNORMAL HIGH (ref 30–100)

## 2023-03-30 LAB — TSH: TSH: 1.25 m[IU]/L (ref 0.40–4.50)

## 2023-04-01 ENCOUNTER — Other Ambulatory Visit: Payer: Self-pay | Admitting: Adult Health

## 2023-04-01 DIAGNOSIS — E1121 Type 2 diabetes mellitus with diabetic nephropathy: Secondary | ICD-10-CM

## 2023-04-01 DIAGNOSIS — E559 Vitamin D deficiency, unspecified: Secondary | ICD-10-CM

## 2023-04-01 MED ORDER — VITAMIN D3 25 MCG (1000 UT) PO CAPS: 1000.0000 [IU] | ORAL_CAPSULE | Freq: Every day | ORAL | 3 refills | Status: AC

## 2023-04-01 MED ORDER — EMPAGLIFLOZIN 25 MG PO TABS: 25.0000 mg | ORAL_TABLET | Freq: Every day | ORAL | 3 refills | Status: DC

## 2023-04-01 NOTE — Progress Notes (Signed)
-    A1C 7.9, up from 7.2, increase Jardiance from 10 mg daily to Jardiance 25 mg daily, continue Trulicity and Metformin -  electrolytes normal, kidney function improved -  vitamin D level elevated, discontinue Vitamin D 50,000 weekly and start Vitamin D3 1,000 units daily -  no anemia -  urine microalbumin, tsh normal

## 2023-04-07 ENCOUNTER — Encounter (INDEPENDENT_AMBULATORY_CARE_PROVIDER_SITE_OTHER): Payer: Self-pay | Admitting: Cardiology

## 2023-04-07 DIAGNOSIS — G4733 Obstructive sleep apnea (adult) (pediatric): Secondary | ICD-10-CM | POA: Diagnosis not present

## 2023-04-08 ENCOUNTER — Other Ambulatory Visit: Payer: Self-pay | Admitting: Physician Assistant

## 2023-04-10 ENCOUNTER — Ambulatory Visit: Attending: Cardiology

## 2023-04-10 DIAGNOSIS — R0683 Snoring: Secondary | ICD-10-CM

## 2023-04-10 NOTE — Procedures (Signed)
 SLEEP STUDY REPORT Patient Information Study Date: 04/07/2023 Patient Name: Walter Gonzales Patient ID: [id number] Birth Date: 1965-06-15 Age: 58 Gender: Male BMI: 39.9 (W=311 lb, H=6' 2'') Stopbang: 7 Referring Physician: Armanda Magic, MD  TEST DESCRIPTION: Home sleep apnea testing was completed using the WatchPat, a Type 1 device, utilizing  peripheral arterial tonometry (PAT), chest movement, actigraphy, pulse oximetry, pulse rate, body position and snore.  AHI was calculated with apnea and hypopnea using valid sleep time as the denominator. RDI includes apneas,  hypopneas, and RERAs. The data acquired and the scoring of sleep and all associated events were performed in  accordance with the recommended standards and specifications as outlined in the AASM Manual for the Scoring of  Sleep and Associated Events 2.2.0 (2015).   FINDINGS:   1. Minimal Obstructive Sleep Apnea with AHI 5/hr. All events occurred mainly in supine sleep.  2. No Central Sleep Apnea with pAHIc 0.2/hr.   3. Oxygen desaturations as low as 90%.   4. Moderate to severe snoring was present. O2 sats were < 88% for 0 min.   5. Total sleep time was 6 hrs and 1 min.   6. 21.7% of total sleep time was spent in REM sleep.   7. Normal sleep onset latency at 23 min.   8. Shortened REM sleep onset latency at 78 min.   9. Total awakenings were 10.  10. Arrhythmia detection: None  DIAGNOSIS: Minimal Obstructive Sleep Apnea (G47.33)  RECOMMENDATIONS: 1. Clinical correlation of these findings is necessary. The decision to treat obstructive sleep apnea (OSA) is usually  based on the presence of apnea symptoms or the presence of associated medical conditions such as Hypertension,  Congestive Heart Failure, Atrial Fibrillation or Obesity. The most common symptoms of OSA are snoring, gasping for  breath while sleeping, daytime sleepiness and fatigue.   2. Initiating apnea therapy is recommended given the presence of  symptoms and/or associated conditions.  Recommend proceeding with one of the following:   a. Auto-CPAP therapy with a pressure range of 5-20cm H2O.   b. An oral appliance (OA) that can be obtained from certain dentists with expertise in sleep medicine. These are  primarily of use in non-obese patients with mild and moderate disease.   c. An ENT consultation which may be useful to look for specific causes of obstruction and possible treatment  options.   d. If patient is intolerant to PAP therapy, consider referral to ENT for evaluation for hypoglossal nerve stimulator.   3. Close follow-up is necessary to ensure success with CPAP or oral appliance therapy for maximum benefit .  4. A follow-up oximetry study on CPAP is recommended to assess the adequacy of therapy and determine the need  for supplemental oxygen or the potential need for Bi-level therapy. An arterial blood gas to determine the adequacy of  baseline ventilation and oxygenation should also be considered.  5. Healthy sleep recommendations include: adequate nightly sleep (normal 7-9 hrs/night), avoidance of caffeine after  noon and alcohol near bedtime, and maintaining a sleep environment that is cool, dark and quiet.  6. Weight loss for overweight patients is recommended. Even modest amounts of weight loss can significantly  improve the severity of sleep apnea.  7. Snoring recommendations include: weight loss where appropriate, side sleeping, and avoidance of alcohol before  bed.  8. Operation of motor vehicle should be avoided when sleepy.  Signature: Armanda Magic, MD; [facility]; Diplomat, American Board of Sleep  Medicine Electronically Signed:  04/10/2023 11:35:25 AM

## 2023-04-13 ENCOUNTER — Telehealth: Payer: Self-pay | Admitting: *Deleted

## 2023-04-13 NOTE — Telephone Encounter (Signed)
-----   Message from Armanda Magic sent at 04/10/2023 11:37 AM EDT ----- Minimal obstructive sleep apnea with an AHI of 5/h.  Most events occurred during supine sleep.  No therapy recommended at this time.  Would avoid sleeping supine

## 2023-04-13 NOTE — Telephone Encounter (Addendum)
 The patient has been notified of the result and verbalized understanding.  All questions (if any) were answered. Walter Gonzales, CMA 04/13/2023 10:17 AM   Pt is agreeable to normal results and doctor recommendations.

## 2023-04-14 ENCOUNTER — Other Ambulatory Visit (HOSPITAL_COMMUNITY): Payer: Self-pay

## 2023-04-14 MED ORDER — TRULICITY 3 MG/0.5ML ~~LOC~~ SOAJ
3.0000 mg | SUBCUTANEOUS | 2 refills | Status: DC
Start: 2023-04-14 — End: 2023-09-27
  Filled 2023-04-14: qty 2, 28d supply, fill #0
  Filled 2023-05-07: qty 2, 28d supply, fill #1
  Filled 2023-06-04: qty 2, 28d supply, fill #2

## 2023-04-19 ENCOUNTER — Ambulatory Visit: Payer: BC Managed Care – PPO

## 2023-04-23 ENCOUNTER — Telehealth: Payer: Self-pay

## 2023-04-23 NOTE — Telephone Encounter (Signed)
 Left message on voicemail to return call to the office to schedule 1 year follow up with Dr Barron Alvine or POD B apps.  Will continue efforts.

## 2023-04-23 NOTE — Telephone Encounter (Signed)
 Patient returned call and was scheduled to follow up with Quentin Mulling, PA-C on 05-31-23 at 9:20am.

## 2023-04-23 NOTE — Telephone Encounter (Signed)
-----   Message from [facility] sent at 06/12/2022  9:51 AM EDT ----- Regarding: May 2025 1 yr follow up, Amy Esterwood, constipation, GERD, colon cancer screening, nhr

## 2023-04-26 ENCOUNTER — Ambulatory Visit: Admitting: Podiatry

## 2023-04-26 ENCOUNTER — Encounter: Payer: Self-pay | Admitting: Podiatry

## 2023-04-26 DIAGNOSIS — B351 Tinea unguium: Secondary | ICD-10-CM | POA: Diagnosis not present

## 2023-04-26 DIAGNOSIS — E1149 Type 2 diabetes mellitus with other diabetic neurological complication: Secondary | ICD-10-CM | POA: Diagnosis not present

## 2023-04-26 DIAGNOSIS — M79674 Pain in right toe(s): Secondary | ICD-10-CM

## 2023-04-26 DIAGNOSIS — E1140 Type 2 diabetes mellitus with diabetic neuropathy, unspecified: Secondary | ICD-10-CM | POA: Diagnosis not present

## 2023-04-26 DIAGNOSIS — M79675 Pain in left toe(s): Secondary | ICD-10-CM | POA: Diagnosis not present

## 2023-04-26 NOTE — Progress Notes (Signed)
 Subjective:   Patient ID: Walter Gonzales, male   DOB: 58 y.o.   MRN: [id number]   HPI Patient presents with elongated nailbeds 1-5 both feet that are thick and can be hard for him to wear shoe gear with.  Cannot manage on his own and patient does have diabetes with mild neuropathic symptomatology with his last A1c being 8.2.  Does not smoke tries to be active   Review of Systems  All other systems reviewed and are negative.       Objective:  Physical Exam Vitals and nursing note reviewed.  Constitutional:      Appearance: He is well-developed.  Pulmonary:     Effort: Pulmonary effort is normal.  Musculoskeletal:        General: Normal range of motion.  Skin:    General: Skin is warm.  Neurological:     Mental Status: He is alert.     Neurovascular status found to be intact muscle strength found to be adequate range of motion within normal limits.  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  I debrided nailbeds 1-5 both feet no iatrogenic bleeding and I did discuss inspections daily of his feet and if any issues were to occur to reappoint immediately

## 2023-05-10 ENCOUNTER — Ambulatory Visit: Payer: BC Managed Care – PPO | Admitting: Cardiology

## 2023-05-30 NOTE — Progress Notes (Signed)
 05/31/2023 Walter Gonzales [id number] 1965-11-01  Referring provider: Duncan Gibson* Primary GI doctor: Dr. Karene Oto  ASSESSMENT AND PLAN:  Constipation 12/2018 colonoscopy [facility] internal hemorrhoids otherwise unremarkable, recall 2030 CT abdomen pelvis 03/2022 ER visit normal stool burden - continue Linzess 290 mcg - Increase fiber/ water intake, decrease caffeine, increase activity level.  GERD, no dysphagia, no melena 12/2018 EGD [place] showed hiatal hernia and negative H. pylori gastritis On mobic  15mg  daily, on tumeric, on trulicity , occ NSAIDS in addition if he bowls, no ETOH On omeprazole 40 mg daily  - cut back on NSAIDS - start to take omeprazole 30 mins to an hour before food, try at dinner to avoid thyroid  medication -can take pepcid  as needed - if any dysphagia, melena, worsening GERD will schedule for EGD  Type 2 diabetes On Trulicity  likely contributing to constipation/EGD, discussed with patient, given gastroparesis diet  Morbid obesity  Body mass index is 38.87 kg/m.  -Patient has been advised to make an attempt to improve diet and exercise patterns to aid in weight loss. -Recommended diet heavy in fruits and veggies and low in animal meats, cheeses, and dairy products, appropriate calorie intake  Decreased ejection fraction Echo 2018 EF 45 to 50% No symptoms, follow up with cardiology   Patient Care Team: Medina-Vargas, Sid Dragon, NP as PCP - General (Internal Medicine) Jacqueline Matsu, MD as PCP - Cardiology (Cardiology)  HISTORY OF PRESENT ILLNESS: 58 y.o. male with a past medical history listed below presents for evaluation of constipation and yearly follow-up for GERD, constipation.   Discussed the use of AI scribe software for clinical note transcription with the patient, who gave verbal consent to proceed.  History of Present Illness   Walter Gonzales is a 58 year old male with hemorrhoids and hiatal hernia who presents for a  gastroenterology follow-up.  He has a history of hemorrhoids identified during a colonoscopy in 2020, which otherwise showed a healthy colon. He is not due for another colonoscopy until 2030 unless symptoms such as bleeding occur. A CT abdomen in March 2024 showed stool burden. He is currently taking Linzess 290 mcg daily, which helps with constipation as long as he maintains adequate fluid intake. He drinks about one to three bottles of water daily and plans to increase this to four bottles.  He has a history of gastroesophageal reflux disease (GERD) with a hiatal hernia identified during an endoscopy in 2020, which was negative for H. pylori. He takes omeprazole once daily, typically 30 minutes before food, and sometimes experiences breakthrough symptoms for which he does not currently take Pepcid  or famotidine . No trouble swallowing or black stools, although he notes dark stools after consuming strawberries.  He is on multiple medications including meloxicam , which he takes daily, sometimes with food. He also takes turmeric, which causes gagging if not taken with food. He occasionally takes tramadol, especially before bowling, and uses a generic [employer] brand pain reliever. He denies using other NSAIDs like Aleve or ibuprofen regularly.  He is on Trulicity , administered every Sunday morning, which can slow gastric motility and cause symptoms like nausea and reflux. He manages these symptoms by adhering to a gastroparesis diet on the day of and after the injection, focusing on soft proteins and small, frequent meals.  He denies alcohol use and is usually the designated driver. He has gained five pounds recently and is monitoring his weight, especially given a history of heart issues noted in 2023. No shortness of breath, chest  discomfort, fevers, chills, trouble swallowing, or black stools.      He  reports that he has never smoked. He has never used smokeless tobacco. He reports current alcohol use.  He reports that he does not use drugs.  RELEVANT GI HISTORY, IMAGING AND LABS: Results   RADIOLOGY CT abdomen: Stool burden (03/2022)  DIAGNOSTIC Colonoscopy: Hemorrhoids (2020) Endoscopy: Hiatal hernia, negative for H. pylori (2020)      CBC    Component Value Date/Time   WBC 6.4 03/29/2023 1054   RBC 5.58 03/29/2023 1054   HGB 14.2 03/29/2023 1054   HCT 45.9 03/29/2023 1054   PLT 247 03/29/2023 1054   MCV 82.3 03/29/2023 1054   MCH 25.4 (L) 03/29/2023 1054   MCHC 30.9 (L) 03/29/2023 1054   RDW 14.5 03/29/2023 1054   LYMPHSABS 2,218 07/27/2022 0820   MONOABS 0.4 04/03/2022 1449   EOSABS 166 03/29/2023 1054   BASOSABS 32 03/29/2023 1054   Recent Labs    07/27/22 0820 03/29/23 1054  HGB 13.6 14.2    CMP     Component Value Date/Time   NA 141 03/29/2023 1054   K 4.1 03/29/2023 1054   CL 105 03/29/2023 1054   CO2 26 03/29/2023 1054   GLUCOSE 123 03/29/2023 1054   BUN 21 03/29/2023 1054   CREATININE 1.32 (H) 03/29/2023 1054   CALCIUM 10.2 03/29/2023 1054   PROT 6.5 07/27/2022 0820   ALBUMIN 4.4 04/03/2022 1449   AST 15 07/27/2022 0820   ALT 23 07/27/2022 0820   ALKPHOS 69 04/03/2022 1449   BILITOT 0.5 07/27/2022 0820   GFRNONAA 38 (L) 04/03/2022 1449   GFRAA 52 (L) 12/28/2018 0516      Latest Ref Rng & Units 07/27/2022    8:20 AM 04/03/2022    2:49 PM 03/15/2022    1:14 PM  Hepatic Function  Total Protein 6.1 - 8.1 g/dL 6.5  7.4  7.3   Albumin 3.5 - 5.0 g/dL  4.4  4.3   AST 10 - 35 U/L 15  27  28    ALT 9 - 46 U/L 23  31  28    Alk Phosphatase 38 - 126 U/L  69  76   Total Bilirubin 0.2 - 1.2 mg/dL 0.5  1.4  1.3   Bilirubin, Direct 0.0 - 0.2 mg/dL   0.2       Current Medications:   Current Outpatient Medications (Endocrine & Metabolic):    Dulaglutide  (TRULICITY ) 3 MG/0.5ML SOAJ, Inject 3 mg into the skin once a week.   empagliflozin  (JARDIANCE ) 25 MG TABS tablet, Take 1 tablet (25 mg total) by mouth daily before breakfast.   levothyroxine  (SYNTHROID ,  LEVOTHROID) 100 MCG tablet, Take 100 mcg by mouth daily before breakfast.   metFORMIN  (GLUCOPHAGE ) 1000 MG tablet, Take 1,000 mg by mouth 2 (two) times daily.  Current Outpatient Medications (Cardiovascular):    amLODipine  (NORVASC ) 10 MG tablet, Take 1 tablet (10 mg total) by mouth daily.   atorvastatin (LIPITOR) 40 MG tablet, Take 1 tablet by mouth once daily for 30 days for 90 days   carvedilol  (COREG ) 25 MG tablet, Take 1 tablet by mouth twice daily   olmesartan -hydrochlorothiazide  (BENICAR  HCT) 40-12.5 MG tablet, Take 1 tablet by mouth once daily   Current Outpatient Medications (Analgesics):    indomethacin  (INDOCIN ) 50 MG capsule, Take 1 capsule (50 mg total) by mouth daily as needed.   meloxicam  (MOBIC ) 15 MG tablet, Take 15 mg by mouth every other day. Take with food  traMADol (ULTRAM) 50 MG tablet, Take 50 mg by mouth daily as needed (pain).    Current Outpatient Medications (Other):    Cholecalciferol (VITAMIN D3) 25 MCG (1000 UT) CAPS, Take 1 capsule (1,000 Units total) by mouth daily.   Cinnamon 500 MG TABS, Take 2,000 mg by mouth daily.   famotidine  (PEPCID ) 40 MG tablet, Take 1 tablet (40 mg total) by mouth at bedtime.   linaclotide (LINZESS) 290 MCG CAPS capsule, Take 290 mcg by mouth every other day.   omeprazole (PRILOSEC) 40 MG capsule, TAKE 1 CAPSULE BY MOUTH 30 MINUTES BEFORE MORNING MEAL ONCE DAILY   polyethylene glycol (MIRALAX / GLYCOLAX) 17 g packet, Take 17 g by mouth daily as needed.   Turmeric (QC TUMERIC COMPLEX) 500 MG CAPS, Take by mouth.   zinc gluconate 50 MG tablet, Take 50 mg by mouth daily.  Medical History:  Past Medical History:  Diagnosis Date   Diabetes mellitus without complication (HCC)    Gout    Hypercholesterolemia    Hypertension    Morbid obesity (HCC)    SVT (supraventricular tachycardia) (HCC)    adenosine  sensitive short RP SVT   Thyroid  disease    Hypothyroidism   Allergies: No Known Allergies   Surgical History:  He  has a  past surgical history that includes Achilles tendon repair (Right). Family History:  His family history includes Diabetes in his maternal grandmother and mother; Heart attack in his father and mother; Heart failure in his maternal grandmother; High Cholesterol in his maternal grandmother; High blood pressure in his maternal grandmother; Hypertension in his mother; Pancreatic cancer in his mother.  REVIEW OF SYSTEMS  : All other systems reviewed and negative except where noted in the History of Present Illness.  PHYSICAL EXAM: BP 130/80   Pulse 93   Ht 6\' 3"  (1.905 m)   Wt (!) 311 lb (141.1 kg)   BMI 38.87 kg/m  Physical Exam   GENERAL APPEARANCE: Well nourished, in no apparent distress HEENT: No cervical lymphadenopathy, unremarkable thyroid , sclerae anicteric, conjunctiva pink RESPIRATORY: Respiratory effort normal, breath sounds clear to auscultation bilaterally without rales, rhonchi, or wheezing CARDIO: Regular rate and rhythm with no murmurs, rubs, or gallops, peripheral pulses intact ABDOMEN: Soft, non-distended, active bowel sounds in all 4 quadrants, no tenderness to palpation, no rebound, no mass appreciated RECTAL: Declines MUSCULOSKELETAL: Full range of motion, normal gait, without edema SKIN: Dry, intact without rashes or lesions. No jaundice. NEURO: Alert, oriented, no focal deficits PSYCH: Cooperative, normal mood and affect. EXTREMITIES: No edema      Edmonia Gottron, PA-C 10:04 AM

## 2023-05-31 ENCOUNTER — Ambulatory Visit: Admitting: Physician Assistant

## 2023-05-31 ENCOUNTER — Encounter: Payer: Self-pay | Admitting: Physician Assistant

## 2023-05-31 VITALS — BP 130/80 | HR 93 | Ht 75.0 in | Wt 311.0 lb

## 2023-05-31 DIAGNOSIS — K219 Gastro-esophageal reflux disease without esophagitis: Secondary | ICD-10-CM | POA: Diagnosis not present

## 2023-05-31 DIAGNOSIS — Z7985 Long-term (current) use of injectable non-insulin antidiabetic drugs: Secondary | ICD-10-CM

## 2023-05-31 DIAGNOSIS — E119 Type 2 diabetes mellitus without complications: Secondary | ICD-10-CM

## 2023-05-31 DIAGNOSIS — N1831 Chronic kidney disease, stage 3a: Secondary | ICD-10-CM

## 2023-05-31 DIAGNOSIS — K5900 Constipation, unspecified: Secondary | ICD-10-CM

## 2023-05-31 DIAGNOSIS — Z1211 Encounter for screening for malignant neoplasm of colon: Secondary | ICD-10-CM

## 2023-05-31 DIAGNOSIS — Z6838 Body mass index (BMI) 38.0-38.9, adult: Secondary | ICD-10-CM

## 2023-05-31 DIAGNOSIS — E1121 Type 2 diabetes mellitus with diabetic nephropathy: Secondary | ICD-10-CM

## 2023-05-31 MED ORDER — FAMOTIDINE 40 MG PO TABS: 40.0000 mg | ORAL_TABLET | Freq: Every day | ORAL | 4 refills | Status: DC

## 2023-05-31 NOTE — Patient Instructions (Addendum)
 Follow up in 12 months.  Try to limit aleve, ibuprofen, goody powders,, mobic  as these are antiinflammatories and can cause inflammation in your stomach, increase bleeding risk and cause ulcers.  You can talk with PCP about alternative pain options.  Can do tyelnol max 3000 mg a day, salon pas patches are over the counter and voltern gel is topical antiinflammatory that is safe.   Please take your proton pump inhibitor medication, omeprazole 40 mg  Please take this medication 30 minutes to 1 hour before meals- this makes it more effective.  Can take pepcid /famotadine as needed or at night Avoid spicy and acidic foods Avoid fatty foods Limit your intake of coffee, tea, alcohol, and carbonated drinks Work to maintain a healthy weight Keep the head of the bed elevated at least 3 inches with blocks or a wedge pillow if you are having any nighttime symptoms Stay upright for 2 hours after eating Avoid meals and snacks three to four hours before bedtime  Toileting tips to help with your constipation - Drink at least 64-80 ounces of water/liquid per day. - Establish a time to try to move your bowels every day.  For many people, this is after a cup of coffee or after a meal such as breakfast. - Sit all of the way back on the toilet keeping your back fairly straight and while sitting up, try to rest the tops of your forearms on your upper thighs.   - Raising your feet with a step stool/squatty potty can be helpful to improve the angle that allows your stool to pass through the rectum. - Relax the rectum feeling it bulge toward the toilet water.  If you feel your rectum raising toward your body, you are contracting rather than relaxing. - Breathe in and slowly exhale. "Belly breath" by expanding your belly towards your belly button. Keep belly expanded as you gently direct pressure down and back to the anus.  A low pitched GRRR sound can assist with increasing intra-abdominal pressure.  (Can also trying  to blow on a pinwheel and make it move, this helps with the same belly breathing) - Repeat 3-4 times. If unsuccessful, contract the pelvic floor to restore normal tone and get off the toilet.  Avoid excessive straining. - To reduce excessive wiping by teaching your anus to normally contract, place hands on outer aspect of knees and resist knee movement outward.  Hold 5-10 second then place hands just inside of knees and resist inward movement of knees.  Hold 5 seconds.  Repeat a few times each way.  Go to the ER if unable to pass gas, severe AB pain, unable to hold down food, any shortness of breath of chest pain.  Thank you for trusting me with your gastrointestinal care!   Santina Cull, PA-C  _______________________________________________________  If your blood pressure at your visit was 140/90 or greater, please contact your primary care physician to follow up on this.  _______________________________________________________  If you are age 51 or older, your body mass index should be between 23-30. Your Body mass index is 38.87 kg/m. If this is out of the aforementioned range listed, please consider follow up with your Primary Care Provider.  If you are age 76 or younger, your body mass index should be between 19-25. Your Body mass index is 38.87 kg/m. If this is out of the aformentioned range listed, please consider follow up with your Primary Care Provider.   ________________________________________________________  The [facility] providers would like to  encourage you to use MYCHART to communicate with providers for non-urgent requests or questions.  Due to long hold times on the telephone, sending your provider a message by [facility] may be a faster and more efficient way to get a response.  Please allow 48 business hours for a response.  Please remember that this is for non-urgent requests.  _______________________________________________________    Gastroparesis Please do small  frequent meals like 4-6 meals a day.  Eat and drink liquids at separate times.  Avoid high fiber foods, cook your vegetables, avoid high fat food.  Suggest spreading protein throughout the day (greek yogurt, glucerna, soft meat, milk, eggs) Choose soft foods that you can mash with a fork When you are more symptomatic, change to pureed foods foods and liquids.  Consider reading "Living well with Gastroparesis" by Creasie Doctor Gastroparesis is a condition in which food takes longer than normal to empty from the stomach. This condition is also known as delayed gastric emptying. It is usually a long-term (chronic) condition. There is no cure, but there are treatments and things that you can do at home to help relieve symptoms. Treating the underlying condition that causes gastroparesis can also help relieve symptoms What are the causes? In many cases, the cause of this condition is not known. Possible causes include: A hormone (endocrine) disorder, such as hypothyroidism or diabetes. A nervous system disease, such as Parkinson's disease or multiple sclerosis. Cancer, infection, or surgery that affects the stomach or vagus nerve. The vagus nerve runs from your chest, through your neck, and to the lower part of your brain. A connective tissue disorder, such as scleroderma. Certain medicines. What increases the risk? You are more likely to develop this condition if: You have certain disorders or diseases. These may include: An endocrine disorder. An eating disorder. Amyloidosis. Scleroderma. Parkinson's disease. Multiple sclerosis. Cancer or infection of the stomach or the vagus nerve. You have had surgery on your stomach or vagus nerve. You take certain medicines. You are male. What are the signs or symptoms? Symptoms of this condition include: Feeling full after eating very little or a loss of appetite. Nausea, vomiting, or heartburn. Bloating of your abdomen. Inconsistent blood  sugar (glucose) levels on blood tests. Unexplained weight loss. Acid from the stomach coming up into the esophagus (gastroesophageal reflux). Sudden tightening (spasm) of the stomach, which can be painful. Symptoms may come and go. Some people may not notice any symptoms. How is this diagnosed? This condition is diagnosed with tests, such as: Tests that check how long it takes food to move through the stomach and intestines. These tests include: Upper gastrointestinal (GI) series. For this test, you drink a liquid that shows up well on X-rays, and then X-rays are taken of your intestines. Gastric emptying scintigraphy. For this test, you eat food that contains a small amount of radioactive material, and then scans are taken. Wireless capsule GI monitoring system. For this test, you swallow a pill (capsule) that records information about how foods and fluid move through your stomach. Gastric manometry. For this test, a tube is passed down your throat and into your stomach to measure electrical and muscular activity. Endoscopy. For this test, a long, thin tube with a camera and light on the end is passed down your throat and into your stomach to check for problems in your stomach lining. Ultrasound. This test uses sound waves to create images of the inside of your body. This can help rule out gallbladder disease or pancreatitis  as a cause of your symptoms. How is this treated? There is no cure for this condition, but treatment and home care may relieve symptoms. Treatment may include: Treating the underlying cause. Managing your symptoms by making changes to your diet and exercise habits. Taking medicines to control nausea and vomiting and to stimulate stomach muscles. Getting food through a feeding tube in the hospital. This may be done in severe cases. Having surgery to insert a device called a gastric electrical stimulator into your body. This device helps improve stomach emptying and control  nausea and vomiting. Follow these instructions at home: Take over-the-counter and prescription medicines only as told by your health care provider. Follow instructions from your health care provider about eating or drinking restrictions. Your health care provider may recommend that you: Eat smaller meals more often. Eat low-fat foods. Eat low-fiber forms of high-fiber foods. For example, eat cooked vegetables instead of raw vegetables. Have only liquid foods instead of solid foods. Liquid foods are easier to digest. Drink enough fluid to keep your urine pale yellow. Exercise as often as told by your health care provider. Keep all follow-up visits. This is important. Contact a health care provider if you: Notice that your symptoms do not improve with treatment. Have new symptoms. Get help right away if you: Have severe pain in your abdomen that does not improve with treatment. Have nausea that is severe or does not go away. Vomit every time you drink fluids. Summary Gastroparesis is a long-term (chronic) condition in which food takes longer than normal to empty from the stomach. Symptoms include nausea, vomiting, heartburn, bloating of your abdomen, and loss of appetite. Eating smaller portions, low-fat foods, and low-fiber forms of high-fiber foods may help you manage your symptoms. Get help right away if you have severe pain in your abdomen. This information is not intended to replace advice given to you by your health care provider. Make sure you discuss any questions you have with your health care provider. Document Revised: 05/26/2019 Document Reviewed: 05/26/2019 Elsevier Patient Education  2021 [employer].

## 2023-06-07 ENCOUNTER — Other Ambulatory Visit (HOSPITAL_COMMUNITY): Payer: Self-pay

## 2023-06-07 MED ORDER — MOUNJARO 2.5 MG/0.5ML ~~LOC~~ SOAJ
2.5000 mg | SUBCUTANEOUS | 1 refills | Status: DC
Start: 2023-06-07 — End: 2023-09-27
  Filled 2023-06-07 (×2): qty 2, 28d supply, fill #0
  Filled 2023-07-05: qty 2, 28d supply, fill #1

## 2023-06-12 NOTE — Progress Notes (Signed)
 Agree with the assessment and plan as outlined by Quentin Mulling, PA-C. ? ?Keron Neenan, DO, FACG ? ?

## 2023-07-05 ENCOUNTER — Other Ambulatory Visit (HOSPITAL_COMMUNITY): Payer: Self-pay

## 2023-07-05 MED ORDER — MOUNJARO 5 MG/0.5ML ~~LOC~~ SOAJ
5.0000 mg | SUBCUTANEOUS | 1 refills | Status: DC
  Filled 2023-07-05: qty 2, 28d supply, fill #0
  Filled 2023-07-28: qty 2, 28d supply, fill #1

## 2023-07-27 ENCOUNTER — Other Ambulatory Visit: Payer: Self-pay

## 2023-07-27 ENCOUNTER — Telehealth: Payer: Self-pay

## 2023-07-27 DIAGNOSIS — E1121 Type 2 diabetes mellitus with diabetic nephropathy: Secondary | ICD-10-CM

## 2023-07-27 MED ORDER — EMPAGLIFLOZIN 25 MG PO TABS: 25.0000 mg | ORAL_TABLET | Freq: Every day | ORAL | 3 refills | Status: AC

## 2023-07-27 NOTE — Telephone Encounter (Signed)
 Rx Medication Refill for Jardiance  sent to the pharmacy

## 2023-08-17 ENCOUNTER — Encounter: Payer: Self-pay | Admitting: Advanced Practice Midwife

## 2023-08-20 ENCOUNTER — Other Ambulatory Visit (HOSPITAL_COMMUNITY): Payer: Self-pay

## 2023-08-23 ENCOUNTER — Ambulatory Visit: Admitting: Cardiology

## 2023-09-13 ENCOUNTER — Other Ambulatory Visit (HOSPITAL_COMMUNITY): Payer: Self-pay

## 2023-09-13 ENCOUNTER — Ambulatory Visit (INDEPENDENT_AMBULATORY_CARE_PROVIDER_SITE_OTHER): Admitting: Podiatry

## 2023-09-13 ENCOUNTER — Encounter: Payer: Self-pay | Admitting: Podiatry

## 2023-09-13 DIAGNOSIS — M79674 Pain in right toe(s): Secondary | ICD-10-CM

## 2023-09-13 DIAGNOSIS — E1149 Type 2 diabetes mellitus with other diabetic neurological complication: Secondary | ICD-10-CM

## 2023-09-13 DIAGNOSIS — E1140 Type 2 diabetes mellitus with diabetic neuropathy, unspecified: Secondary | ICD-10-CM | POA: Diagnosis not present

## 2023-09-13 DIAGNOSIS — B351 Tinea unguium: Secondary | ICD-10-CM

## 2023-09-13 DIAGNOSIS — M79675 Pain in left toe(s): Secondary | ICD-10-CM | POA: Diagnosis not present

## 2023-09-13 MED ORDER — MOUNJARO 7.5 MG/0.5ML ~~LOC~~ SOAJ
7.5000 mg | SUBCUTANEOUS | 0 refills | Status: DC
  Filled 2023-09-13: qty 2, 28d supply, fill #0

## 2023-09-14 NOTE — Progress Notes (Signed)
 Subjective:   Patient ID: Walter Gonzales, male   DOB: 58 y.o.   MRN: [id number]   HPI Patient presents with severe nail disease 1-5 both feet and concerned about some bleeding of his right big toenail with history of having had it removed and does not remember trauma   ROS      Objective:  Physical Exam  Neurovascular status was found to be intact with patient being moderate obesity and having diabetes.  He has severely thickened nailbeds 1-5 both feet that are very difficult for him to take care of and has probably undergone subtle trauma to the right hallux nail bed but it appears to be localized     Assessment:  At risk patient with diabetic condition and moderate neuropathy with mycotic nail infection and trauma right hallux nail bed     Plan:  H&P reviewed condition and went ahead today and did sharp sterile debridement of nailbed of right and all nails 1-5 both feet with no iatrogenic bleeding and applied dressing right hallux continue to keep it dressed when he is out and then soak at home.  Reappoint if symptoms create a problem

## 2023-09-20 ENCOUNTER — Other Ambulatory Visit: Payer: Self-pay

## 2023-09-20 ENCOUNTER — Other Ambulatory Visit (HOSPITAL_COMMUNITY): Payer: Self-pay

## 2023-09-20 MED ORDER — MOUNJARO 5 MG/0.5ML ~~LOC~~ SOAJ
5.0000 mg | SUBCUTANEOUS | 0 refills | Status: DC
  Filled 2023-09-20 (×3): qty 2, 28d supply, fill #0

## 2023-09-27 ENCOUNTER — Encounter: Payer: Self-pay | Admitting: Adult Health

## 2023-09-27 ENCOUNTER — Ambulatory Visit: Payer: BC Managed Care – PPO | Admitting: Adult Health

## 2023-09-27 VITALS — BP 140/88 | HR 90 | Temp 97.9°F | Ht 75.0 in | Wt 322.4 lb

## 2023-09-27 DIAGNOSIS — E785 Hyperlipidemia, unspecified: Secondary | ICD-10-CM

## 2023-09-27 DIAGNOSIS — K5909 Other constipation: Secondary | ICD-10-CM

## 2023-09-27 DIAGNOSIS — K219 Gastro-esophageal reflux disease without esophagitis: Secondary | ICD-10-CM

## 2023-09-27 DIAGNOSIS — E039 Hypothyroidism, unspecified: Secondary | ICD-10-CM

## 2023-09-27 DIAGNOSIS — I10 Essential (primary) hypertension: Secondary | ICD-10-CM

## 2023-09-27 DIAGNOSIS — M170 Bilateral primary osteoarthritis of knee: Secondary | ICD-10-CM

## 2023-09-27 DIAGNOSIS — E1121 Type 2 diabetes mellitus with diabetic nephropathy: Secondary | ICD-10-CM

## 2023-09-27 MED ORDER — MELOXICAM 15 MG PO TABS: 15.0000 mg | ORAL_TABLET | Freq: Every day | ORAL | Status: AC | PRN

## 2023-09-27 NOTE — Progress Notes (Signed)
 [facility]  Provider:  Jereld Serum DNP  Code Status:  Full Code  Goals of Care:     09/07/2022    8:34 AM  Advanced Directives  Does Patient Have a Medical Advance Directive? No  Would patient like information on creating a medical advance directive? No - Patient declined     Chief Complaint  Patient presents with   Medical Management of Chronic Issues    6 month follow up. Talk about one of his medications   Discussed the use of AI scribe software for clinical note transcription with the patient, who gave verbal consent to proceed.   HPI: Patient is a 58 y.o. male seen today for a 15-month follow up of chronic medical issues.  He experiences knee pain, particularly in the right knee, described as 'bone on bone'. He has not consulted an orthopedic specialist recently. He takes Mobic  15 mg every other day for pain management.  His hypertension is managed with amlodipine  10 mg daily, carvedilol  25 mg twice a day, and olmesartan  hydrochlorothiazide  40/12.5 mg daily. He has not been monitoring his blood pressure at home, and it was elevated at 144/90 mmHg during the visit. He acknowledges that his diet and exercise are not optimal, and his weight has increased.  For type 2 diabetes, he is on metformin  1000 mg twice a day, Mounjaro  5 mg weekly, and Jardiance  25 mg at breakfast. His recent fasting blood sugar was 160 mg/dL, and his last J8r was 2.0% six months ago. He started Mounjaro  two months ago after previously being on Trulicity . He experiences leg cramps and increased urination.  He has a history of esophageal reflux and takes omeprazole 40 mg before meals in the morning and Pepcid  40 mg at bedtime. His acid reflux is stable and not bothersome.  He has a history of gout but reports no recent flare-ups. He takes medication for gout only as needed and avoids alcohol and smoking. He manages constipation with Linzess 290 mcg every other day and Miralax as needed.  For  hypothyroidism, he takes levothyroxine  100 mcg daily, with his last TSH being 1.25 in February. He also takes atorvastatin 40 mg daily for hyperlipidemia.  He weighs 322 pounds, which has increased. He has been on Mounjaro  for two months. He does not consume alcohol or smoke. He reports being tired after work, which affects his ability to exercise regularly.    Past Medical History:  Diagnosis Date   Diabetes mellitus without complication (HCC)    Gout    Hypercholesterolemia    Hypertension    Morbid obesity (HCC)    SVT (supraventricular tachycardia) (HCC)    adenosine  sensitive short RP SVT   Thyroid  disease    Hypothyroidism    Past Surgical History:  Procedure Laterality Date   ACHILLES TENDON REPAIR Right     No Known Allergies  Outpatient Encounter Medications as of 09/27/2023  Medication Sig   amLODipine  (NORVASC ) 10 MG tablet Take 1 tablet (10 mg total) by mouth daily.   atorvastatin (LIPITOR) 40 MG tablet Take 1 tablet by mouth once daily for 30 days for 90 days   carvedilol  (COREG ) 25 MG tablet Take 1 tablet by mouth twice daily   empagliflozin  (JARDIANCE ) 25 MG TABS tablet Take 1 tablet (25 mg total) by mouth daily before breakfast.   famotidine  (PEPCID ) 40 MG tablet Take 1 tablet (40 mg total) by mouth at bedtime.   levothyroxine  (SYNTHROID , LEVOTHROID) 100 MCG tablet Take 100 mcg by  mouth daily before breakfast.   linaclotide (LINZESS) 290 MCG CAPS capsule Take 290 mcg by mouth every other day.   metFORMIN  (GLUCOPHAGE ) 1000 MG tablet Take 1,000 mg by mouth 2 (two) times daily.   olmesartan -hydrochlorothiazide  (BENICAR  HCT) 40-12.5 MG tablet Take 1 tablet by mouth once daily   omeprazole (PRILOSEC) 40 MG capsule TAKE 1 CAPSULE BY MOUTH 30 MINUTES BEFORE MORNING MEAL ONCE DAILY   polyethylene glycol (MIRALAX / GLYCOLAX) 17 g packet Take 17 g by mouth daily as needed.   tirzepatide  (MOUNJARO ) 5 MG/0.5ML Pen Inject 5 mg into the skin once a week.   traMADol (ULTRAM) 50  MG tablet Take 50 mg by mouth daily as needed (pain).    Turmeric (QC TUMERIC COMPLEX) 500 MG CAPS Take by mouth.   zinc gluconate 50 MG tablet Take 50 mg by mouth daily.   [DISCONTINUED] indomethacin  (INDOCIN ) 50 MG capsule Take 1 capsule (50 mg total) by mouth daily as needed.   [DISCONTINUED] meloxicam  (MOBIC ) 15 MG tablet Take 15 mg by mouth every other day. Take with food   Cholecalciferol (VITAMIN D3) 25 MCG (1000 UT) CAPS Take 1 capsule (1,000 Units total) by mouth daily. (Patient not taking: Reported on 09/27/2023)   meloxicam  (MOBIC ) 15 MG tablet Take 1 tablet (15 mg total) by mouth daily as needed for pain. Take with food   [DISCONTINUED] Cinnamon 500 MG TABS Take 2,000 mg by mouth daily. (Patient not taking: Reported on 09/27/2023)   [DISCONTINUED] Dulaglutide  (TRULICITY ) 3 MG/0.5ML SOAJ Inject 3 mg into the skin once a week. (Patient not taking: Reported on 09/27/2023)   [DISCONTINUED] tirzepatide  (MOUNJARO ) 2.5 MG/0.5ML Pen Inject 2.5 mg into the skin once a week. (Patient not taking: Reported on 09/27/2023)   [DISCONTINUED] tirzepatide  (MOUNJARO ) 7.5 MG/0.5ML Pen Inject 7.5 mg into the skin once a week. (Patient not taking: Reported on 09/27/2023)   No facility-administered encounter medications on file as of 09/27/2023.    Review of Systems:  Review of Systems  Constitutional:  Negative for activity change, appetite change and fever.  HENT:  Negative for sore throat.   Eyes: Negative.   Cardiovascular:  Negative for chest pain and leg swelling.  Gastrointestinal:  Negative for abdominal distention, diarrhea and vomiting.  Genitourinary:  Negative for dysuria, frequency and urgency.  Musculoskeletal:        Occasional bilateral knee pain  Skin:  Negative for color change.  Neurological:  Negative for dizziness and headaches.  Psychiatric/Behavioral:  Negative for behavioral problems and sleep disturbance. The patient is not nervous/anxious.     Health Maintenance  Topic Date  Due   DTaP/Tdap/Td (1 - Tdap) Never done   Pneumococcal Vaccine: 50+ Years (1 of 2 - PCV) Never done   Hepatitis B Vaccines 19-59 Average Risk (1 of 3 - 19+ 3-dose series) Never done   OPHTHALMOLOGY EXAM  04/03/2023   Zoster Vaccines- Shingrix (2 of 2) 04/19/2023   HEMOGLOBIN A1C  09/26/2023   COVID-19 Vaccine (2 - Moderna risk series) 12/28/2023 (Originally 12/26/2022)   INFLUENZA VACCINE  04/29/2024 (Originally 08/31/2023)   Diabetic kidney evaluation - eGFR measurement  03/28/2024   Diabetic kidney evaluation - Urine ACR  03/28/2024   FOOT EXAM  03/28/2024   Colonoscopy  01/19/2029   Hepatitis C Screening  Completed   HIV Screening  Completed   HPV VACCINES  Aged Out   Meningococcal B Vaccine  Aged Out    Physical Exam: Vitals:   09/27/23 0849 09/27/23 0852  BP: ROLLEN)  144/90 (!) 140/88  Pulse: 90   Temp: 97.9 F (36.6 C)   SpO2: 97%   Weight: (!) 322 lb 6.4 oz (146.2 kg)   Height: 6' 3 (1.905 m)    Body mass index is 40.3 kg/m. Physical Exam Constitutional:      General: He is not in acute distress.    Appearance: He is obese.  HENT:     Head: Normocephalic and atraumatic.     Mouth/Throat:     Mouth: Mucous membranes are moist.  Eyes:     Conjunctiva/sclera: Conjunctivae normal.  Cardiovascular:     Rate and Rhythm: Normal rate and regular rhythm.     Pulses: Normal pulses.     Heart sounds: Normal heart sounds.  Pulmonary:     Effort: Pulmonary effort is normal.     Breath sounds: Normal breath sounds.  Abdominal:     General: Bowel sounds are normal.     Palpations: Abdomen is soft.  Musculoskeletal:        General: No swelling. Normal range of motion.     Cervical back: Normal range of motion.  Skin:    General: Skin is warm and dry.  Neurological:     General: No focal deficit present.     Mental Status: He is alert and oriented to person, place, and time.  Psychiatric:        Mood and Affect: Mood normal.        Behavior: Behavior normal.         Thought Content: Thought content normal.        Judgment: Judgment normal.     Labs reviewed: Basic Metabolic Panel: Recent Labs    03/29/23 1054  NA 141  K 4.1  CL 105  CO2 26  GLUCOSE 123  BUN 21  CREATININE 1.32*  CALCIUM 10.2  TSH 1.25   Liver Function Tests: No results for input(s): AST, ALT, ALKPHOS, BILITOT, PROT, ALBUMIN in the last 8760 hours. No results for input(s): LIPASE, AMYLASE in the last 8760 hours. No results for input(s): AMMONIA in the last 8760 hours. CBC: Recent Labs    03/29/23 1054  WBC 6.4  NEUTROABS 3,757  HGB 14.2  HCT 45.9  MCV 82.3  PLT 247   Lipid Panel: No results for input(s): CHOL, HDL, LDLCALC, TRIG, CHOLHDL, LDLDIRECT in the last 8760 hours. Lab Results  Component Value Date   HGBA1C 7.9 (H) 03/29/2023    Procedures since last visit: No results found.  Assessment/Plan  1. Type 2 diabetes mellitus with diabetic nephropathy, without long-term current use of insulin  (HCC) (Primary) Lab Results  Component Value Date   HGBA1C 7.9 (H) 03/29/2023    - Continue metformin  1000 mg twice daily. - Continue Mounjaro  5 mg weekly. - Hemoglobin A1c - Ambulatory referral to Ophthalmology - Microalbumin/Creatinine Ratio, Urine  2. Essential hypertension -  Blood pressure elevated at 144/90 mmHg. Goal is <130/80 mmHg. - Encourage home blood pressure monitoring. - Provide blood pressure log. - Advise low salt diet. - Follow up in one month with blood pressure log. - Comprehensive metabolic panel  3. Morbid obesity (HCC) Body mass index is 40.3 kg/m. -  Weight increased to 322 lbs, BMI 40.3. Declined weight management referral. - Set goal of 5 lbs weight loss in one month. - Encourage dietary changes with more vegetables. - Encourage regular exercise.  4. Bilateral primary osteoarthritis of knee - Bilateral knee pain, bone-on-bone. Weight loss recommended. - Refer to Dr. Daldorf  for orthopedic  evaluation. - Advise taking Mobic  15 mg as needed with food. - Discontinue indomethacin . - Ambulatory referral to Orthopedic Surgery - meloxicam  (MOBIC ) 15 MG tablet; Take 1 tablet (15 mg total) by mouth daily as needed for pain. Take with food  5. Hyperlipidemia, unspecified hyperlipidemia type Lab Results  Component Value Date   CHOL 142 07/27/2022   HDL 70 07/27/2022   LDLCALC 56 07/27/2022   TRIG 84 07/27/2022   CHOLHDL 2.0 07/27/2022    -  On atorvastatin 40 mg daily. - Lipid panel  6. Gastroesophageal reflux disease without esophagitis -  well-controlled with current regimen. - Continue omeprazole 40 mg daily before meals. - Continue Pepcid  40 mg at bedtime.  7. Acquired hypothyroidism Lab Results  Component Value Date   TSH 1.25 03/29/2023    -  Continue levothyroxine  100 mcg daily. - TSH  8. Chronic constipation -  Regular bowel movements with current treatment. - Continue Linzess 290 mcg every other day. - Continue Miralax 17 g daily as needed.   General Health Maintenance Due for COVID, tetanus, and pneumonia vaccines. - Plan to take COVID, tetanus, and pneumonia vaccines in November.    Labs/tests ordered:   urine microalbumin creatinine ratio, A1C, CMP, A1C, lipid panel, tsh   Return in about 4 weeks (around 10/25/2023).  Katheen Aslin Medina-Vargas, NP

## 2023-09-28 ENCOUNTER — Ambulatory Visit: Payer: Self-pay | Admitting: Adult Health

## 2023-09-28 LAB — COMPREHENSIVE METABOLIC PANEL WITH GFR
AG Ratio: 1.7 (calc) (ref 1.0–2.5)
ALT: 25 U/L (ref 9–46)
AST: 19 U/L (ref 10–35)
Albumin: 4.4 g/dL (ref 3.6–5.1)
Alkaline phosphatase (APISO): 83 U/L (ref 35–144)
BUN/Creatinine Ratio: 11 (calc) (ref 6–22)
BUN: 16 mg/dL (ref 7–25)
CO2: 26 mmol/L (ref 20–32)
Calcium: 9.8 mg/dL (ref 8.6–10.3)
Chloride: 104 mmol/L (ref 98–110)
Creat: 1.46 mg/dL — ABNORMAL HIGH (ref 0.70–1.30)
Globulin: 2.6 g/dL (ref 1.9–3.7)
Glucose, Bld: 146 mg/dL — ABNORMAL HIGH (ref 65–99)
Potassium: 4.1 mmol/L (ref 3.5–5.3)
Sodium: 139 mmol/L (ref 135–146)
Total Bilirubin: 1.1 mg/dL (ref 0.2–1.2)
Total Protein: 7 g/dL (ref 6.1–8.1)
eGFR: 55 mL/min/1.73m2 — ABNORMAL LOW (ref >=60)

## 2023-09-28 LAB — LIPID PANEL
Cholesterol: 118 mg/dL (ref <200)
HDL: 61 mg/dL (ref >=40)
LDL Cholesterol (Calc): 42 mg/dL
Non-HDL Cholesterol (Calc): 57 mg/dL (ref <130)
Total CHOL/HDL Ratio: 1.9 (calc) (ref <5.0)
Triglycerides: 70 mg/dL (ref <150)

## 2023-09-28 LAB — HEMOGLOBIN A1C
Hgb A1c MFr Bld: 8.7 % — ABNORMAL HIGH (ref <5.7)
Mean Plasma Glucose: 203 mg/dL
eAG (mmol/L): 11.2 mmol/L

## 2023-09-28 LAB — MICROALBUMIN / CREATININE URINE RATIO
Creatinine, Urine: 90 mg/dL (ref 20–320)
Microalb Creat Ratio: 4 mg/g{creat} (ref <30)
Microalb, Ur: 0.4 mg/dL

## 2023-09-28 LAB — TSH: TSH: 2.94 m[IU]/L (ref 0.40–4.50)

## 2023-09-28 NOTE — Progress Notes (Signed)
-     A1C elevated, pls check blood sugar daily, log and bring to next appointment, continue Mounjaro , Metformin  and Jardiance  -  low cab diet, exercise for at least 150 minutes/week - electrolytes, liver enzymes, urine microalbumin creatinine ratio, tsh, lipid panel normal

## 2023-10-16 ENCOUNTER — Other Ambulatory Visit (HOSPITAL_COMMUNITY): Payer: Self-pay

## 2023-10-16 MED ORDER — NIRMATRELVIR&RITONAVIR 300/100 20 X 150 MG & 10 X 100MG PO TBPK
3.0000 | ORAL_TABLET | Freq: Two times a day (BID) | ORAL | 0 refills | Status: DC
  Filled 2023-10-16: qty 30, 5d supply, fill #0

## 2023-10-22 ENCOUNTER — Other Ambulatory Visit (HOSPITAL_COMMUNITY): Payer: Self-pay

## 2023-10-22 MED ORDER — MOUNJARO 5 MG/0.5ML ~~LOC~~ SOAJ
5.0000 mg | SUBCUTANEOUS | 0 refills | Status: DC
  Filled 2023-10-22: qty 2, 28d supply, fill #0

## 2023-10-23 ENCOUNTER — Other Ambulatory Visit: Payer: Self-pay | Admitting: Physician Assistant

## 2023-10-25 ENCOUNTER — Ambulatory Visit: Admitting: Adult Health

## 2023-11-13 ENCOUNTER — Ambulatory Visit: Admitting: Cardiology

## 2023-11-15 ENCOUNTER — Ambulatory Visit: Admitting: Adult Health

## 2023-11-22 ENCOUNTER — Ambulatory Visit: Admitting: Adult Health

## 2023-11-23 ENCOUNTER — Other Ambulatory Visit: Payer: Self-pay | Admitting: Physician Assistant

## 2023-11-29 ENCOUNTER — Ambulatory Visit: Admitting: Adult Health

## 2023-11-29 DIAGNOSIS — E1121 Type 2 diabetes mellitus with diabetic nephropathy: Secondary | ICD-10-CM

## 2023-11-29 DIAGNOSIS — I471 Supraventricular tachycardia, unspecified: Secondary | ICD-10-CM

## 2023-11-29 DIAGNOSIS — I129 Hypertensive chronic kidney disease with stage 1 through stage 4 chronic kidney disease, or unspecified chronic kidney disease: Secondary | ICD-10-CM

## 2023-11-29 DIAGNOSIS — E785 Hyperlipidemia, unspecified: Secondary | ICD-10-CM

## 2023-11-29 DIAGNOSIS — K5909 Other constipation: Secondary | ICD-10-CM

## 2023-11-29 DIAGNOSIS — K219 Gastro-esophageal reflux disease without esophagitis: Secondary | ICD-10-CM

## 2023-11-29 DIAGNOSIS — M170 Bilateral primary osteoarthritis of knee: Secondary | ICD-10-CM

## 2023-11-29 DIAGNOSIS — E039 Hypothyroidism, unspecified: Secondary | ICD-10-CM

## 2023-12-26 ENCOUNTER — Other Ambulatory Visit: Payer: Self-pay | Admitting: Physician Assistant

## 2024-01-09 ENCOUNTER — Other Ambulatory Visit: Payer: Self-pay | Admitting: Cardiology

## 2024-01-25 ENCOUNTER — Other Ambulatory Visit: Payer: Self-pay | Admitting: Physician Assistant

## 2024-02-06 ENCOUNTER — Ambulatory Visit: Admitting: Cardiology

## 2024-02-07 ENCOUNTER — Other Ambulatory Visit: Payer: Self-pay

## 2024-02-07 ENCOUNTER — Other Ambulatory Visit (HOSPITAL_COMMUNITY): Payer: Self-pay

## 2024-02-07 MED ORDER — MOUNJARO 10 MG/0.5ML ~~LOC~~ SOAJ
10.0000 mg | SUBCUTANEOUS | 2 refills | Status: DC
  Filled 2024-02-07: qty 2, 28d supply, fill #0

## 2024-02-08 ENCOUNTER — Other Ambulatory Visit (HOSPITAL_COMMUNITY): Payer: Self-pay

## 2024-02-08 MED ORDER — TRULICITY 1.5 MG/0.5ML ~~LOC~~ SOAJ
1.5000 mg | SUBCUTANEOUS | 2 refills | Status: AC
  Filled 2024-02-08: qty 2, 28d supply, fill #0
  Filled 2024-03-02: qty 2, 28d supply, fill #1

## 2024-02-11 NOTE — Progress Notes (Unsigned)
 "  Office Visit    Patient Name: Walter Gonzales Date of Encounter: 02/11/2024  PCP:  Medina-Vargas, Monina C, NP   [facility]  Cardiologist:  Wilbert Bihari, MD  Advanced Practice Provider:  No care team member to display Electrophysiologist:  None   HPI    Walter Gonzales is a 59 y.o. male with a past medical history of hypertension, diabetes mellitus, hypercholesteremia, SVT, morbid obesity, hypothyroidism and gout presents today for follow-up appointment.  He was last seen August 2023 and then recently been in the ER for hypertension.  Apparently he was having some generalized weakness send on admission to ER blood pressure was 181/131 mmHg.  He had been compliant with his medication.  Initially seen in urgent care and then sent to the emergency room.  Was given IV labetalol  20 mg and HCTZ was resumed.  Troponin was negative x 2.  Current medication regimen includes HCTZ 12.5 mg daily and losartan  100 mg daily, also Lopressor  50 mg twice a day.  When he was seen in follow-up in August he was doing well.  No chest pressure or shortness of breath.  No lower extremity edema, dizziness, syncope.  Occasionally felt a skipped beat.  Tried to avoid salt.  Never been evaluated for OSA.  Feels sleepy when he wakes up in the morning on occasion.  No headache.  He does require daytime nap if he is home during the day.  He does snore at night.  Compliant with medications.  I saw him February 2024, he tells me that he has pain at the center of his chest.  Nonexertional.  It did not necessarily happen after eating but did start in the middle of the night and continued to when he woke up.  He is a part of a bowling league and does not get home until late usually eating around 9 or 10 PM sometimes getting fast food on the way home.  He was started on a PPI.  He has had some weight loss.  Problems with constipation and dehydration.  Dr. Bihari ordered a sleep study but his insurance would not  cover it.  I will send a message to Dalton Molt to problem shoot.   Reports no shortness of breath nor dyspnea on exertion.  No edema, orthopnea, PND. Reports no palpitations.   Today, he ***   Past Medical History    Past Medical History:  Diagnosis Date   Diabetes mellitus without complication (HCC)    Gout    Hypercholesterolemia    Hypertension    Morbid obesity (HCC)    SVT (supraventricular tachycardia)    adenosine  sensitive short RP SVT   Thyroid  disease    Hypothyroidism   Past Surgical History:  Procedure Laterality Date   ACHILLES TENDON REPAIR Right     Allergies  No Known Allergies  EKGs/Labs/Other Studies Reviewed:   The following studies were reviewed today: 10/2021 Renal US  without renal artery stenosis  EKG:  EKG is not ordered today.   Recent Labs: 03/29/2023: Hemoglobin 14.2; Platelets 247 09/27/2023: ALT 25; BUN 16; Creat 1.46; Potassium 4.1; Sodium 139; TSH 2.94  Recent Lipid Panel    Component Value Date/Time   CHOL 118 09/27/2023 0919   TRIG 70 09/27/2023 0919   HDL 61 09/27/2023 0919   CHOLHDL 1.9 09/27/2023 0919   VLDL 50 (H) 10/05/2016 0003   LDLCALC 42 09/27/2023 0919   Home Medications   No outpatient medications have been marked as  taking for the 02/12/24 encounter (Appointment) with Lucien Orren SAILOR, PA-C.     Review of Systems      All other systems reviewed and are otherwise negative except as noted above.  Physical Exam    VS:  There were no vitals taken for this visit. , BMI There is no height or weight on file to calculate BMI.  Wt Readings from Last 3 Encounters:  09/27/23 (!) 322 lb 6.4 oz (146.2 kg)  05/31/23 (!) 311 lb (141.1 kg)  03/29/23 (!) 310 lb 3.2 oz (140.7 kg)     GEN: Well nourished, well developed, in no acute distress. HEENT: normal. Neck: Supple, no JVD, carotid bruits, or masses. Cardiac: RRR, no murmurs, rubs, or gallops. No clubbing, cyanosis, edema.  Radials/PT 2+ and equal bilaterally.   Respiratory:  Respirations regular and unlabored, clear to auscultation bilaterally. GI: Soft, nontender, nondistended. MS: No deformity or atrophy. Skin: Warm and dry, no rash. Neuro:  Strength and sensation are intact. Psych: Normal affect.  Assessment & Plan    Hypertension -renal artery duplex negative -we switched his Hyzaar to Benicar  for better BP control -asked him to track his HR and BP at home -borderline tachycardia on coreg  25mg  BID -continue low-sodium, heart healthy diet -2 week nursing visit  SVT -no repeat episodes at this time  OSA? -excessive daytime sleepiness and needing naps during the day -snoring -SVT, HTN, extreme fatigue -would recommend at home sleep study     Disposition: Follow up 3-4 with Wilbert Bihari, MD or APP.  Signed, Orren SAILOR Lucien, PA-C 02/11/2024, 6:15 AM [facility]  "

## 2024-02-12 ENCOUNTER — Other Ambulatory Visit (HOSPITAL_COMMUNITY): Payer: Self-pay

## 2024-02-12 ENCOUNTER — Ambulatory Visit: Admitting: Physician Assistant

## 2024-02-12 DIAGNOSIS — R0683 Snoring: Secondary | ICD-10-CM

## 2024-02-12 DIAGNOSIS — G4733 Obstructive sleep apnea (adult) (pediatric): Secondary | ICD-10-CM

## 2024-02-12 DIAGNOSIS — I10 Essential (primary) hypertension: Secondary | ICD-10-CM

## 2024-02-12 DIAGNOSIS — I471 Supraventricular tachycardia, unspecified: Secondary | ICD-10-CM

## 2024-02-12 DIAGNOSIS — E1121 Type 2 diabetes mellitus with diabetic nephropathy: Secondary | ICD-10-CM

## 2024-02-12 MED ORDER — AZITHROMYCIN 250 MG PO TABS
ORAL_TABLET | ORAL | 0 refills | Status: DC
  Filled 2024-02-12: qty 6, 5d supply, fill #0

## 2024-02-12 MED ORDER — PSEUDOEPH-BROMPHEN-DM 30-2-10 MG/5ML PO SYRP
10.0000 mL | ORAL_SOLUTION | ORAL | 0 refills | Status: DC | PRN
  Filled 2024-02-12 (×2): qty 120, 2d supply, fill #0

## 2024-02-12 MED ORDER — ONDANSETRON 4 MG PO TBDP
4.0000 mg | ORAL_TABLET | Freq: Three times a day (TID) | ORAL | 0 refills | Status: AC | PRN
  Filled 2024-02-12: qty 15, 5d supply, fill #0

## 2024-02-12 MED ORDER — PREDNISONE 5 MG (21) PO TBPK
ORAL_TABLET | ORAL | 0 refills | Status: DC
  Filled 2024-02-12: qty 21, 6d supply, fill #0

## 2024-02-14 ENCOUNTER — Ambulatory Visit: Admitting: Adult Health

## 2024-02-14 ENCOUNTER — Encounter: Payer: Self-pay | Admitting: Adult Health

## 2024-02-14 VITALS — BP 132/82 | HR 85 | Temp 97.7°F | Ht 75.0 in | Wt 315.4 lb

## 2024-02-14 DIAGNOSIS — Z125 Encounter for screening for malignant neoplasm of prostate: Secondary | ICD-10-CM

## 2024-02-14 DIAGNOSIS — E785 Hyperlipidemia, unspecified: Secondary | ICD-10-CM

## 2024-02-14 DIAGNOSIS — Z0000 Encounter for general adult medical examination without abnormal findings: Secondary | ICD-10-CM

## 2024-02-14 DIAGNOSIS — K219 Gastro-esophageal reflux disease without esophagitis: Secondary | ICD-10-CM

## 2024-02-14 DIAGNOSIS — E559 Vitamin D deficiency, unspecified: Secondary | ICD-10-CM

## 2024-02-14 DIAGNOSIS — E039 Hypothyroidism, unspecified: Secondary | ICD-10-CM

## 2024-02-14 DIAGNOSIS — I10 Essential (primary) hypertension: Secondary | ICD-10-CM

## 2024-02-14 DIAGNOSIS — M170 Bilateral primary osteoarthritis of knee: Secondary | ICD-10-CM

## 2024-02-14 DIAGNOSIS — E1121 Type 2 diabetes mellitus with diabetic nephropathy: Secondary | ICD-10-CM

## 2024-02-14 NOTE — Progress Notes (Signed)
 "  [facility]  Provider:  Jereld Serum DNP  Code Status:  Full Code  Goals of Care:     02/14/2024    8:42 AM  Advanced Directives  Does Patient Have a Medical Advance Directive? No  Would patient like information on creating a medical advance directive? No - Patient declined     Chief Complaint  Patient presents with   Annual Exam    Discussed the use of AI scribe software for clinical note transcription with the patient, who gave verbal consent to proceed.  HPI: Patient is a 59 y.o. male seen today for an annual physical exam.  He experienced dehydration this week due to inadequate water intake and excessive urination, requiring intravenous fluids. He acknowledges not drinking enough water and consuming sweet drinks instead, which contributed to the condition.  He has a history of type 2 diabetes mellitus and is currently taking metformin  1000 mg twice daily, Trulicity  1.5 mg weekly, and Jardiance  25 mg daily. He previously switched from Mounjaro  back to Trulicity  due to vision issues. His last A1c was 8.7, and he monitors his blood sugar at home.  He has hypertension and takes olmesartan -hydrochlorothiazide  40-12.5 mg daily, carvedilol  25 mg twice daily, and amlodipine  10 mg daily.  He has a history of pain in both knees, with the left knee previously receiving an injection. He takes Mobic  15 mg as needed for pain, along with tramadol 50 mg as needed. He reports that his knees are not currently causing him significant pain.  He has a history of vitamin D  deficiency, previously diagnosed but currently not taking supplements as his levels were high upon recheck.  He experiences occasional nausea and takes Zofran  4 mg every 8 hours as needed. He also has acid reflux, managed with omeprazole 40 mg daily and famotidine  40 mg at bedtime.  He has hypothyroidism, managed with levothyroxine  100 mcg daily, and hyperlipidemia, managed with atorvastatin 40 mg daily.  He weighs  315 pounds, with a goal to reduce to 300 pounds. He has lost some weight since the last visit.  He does not consume alcohol or drugs and is a smoker.    Past Medical History:  Diagnosis Date   Diabetes mellitus without complication (HCC)    Gout    Hypercholesterolemia    Hypertension    Morbid obesity (HCC)    SVT (supraventricular tachycardia)    adenosine  sensitive short RP SVT   Thyroid  disease    Hypothyroidism    Past Surgical History:  Procedure Laterality Date   ACHILLES TENDON REPAIR Right     Allergies[1]  Outpatient Encounter Medications as of 02/14/2024  Medication Sig   amLODipine  (NORVASC ) 10 MG tablet Take 1 tablet (10 mg total) by mouth daily.   atorvastatin (LIPITOR) 40 MG tablet Take 40 mg by mouth daily.   carvedilol  (COREG ) 25 MG tablet Take 1 tablet by mouth twice daily   Dulaglutide  (TRULICITY ) 1.5 MG/0.5ML SOAJ Inject 1.5 mg into the skin once a week.   empagliflozin  (JARDIANCE ) 25 MG TABS tablet Take 1 tablet (25 mg total) by mouth daily before breakfast.   famotidine  (PEPCID ) 40 MG tablet TAKE 1 TABLET BY MOUTH AT BEDTIME   levothyroxine  (SYNTHROID , LEVOTHROID) 100 MCG tablet Take 100 mcg by mouth daily before breakfast.   linaclotide  (LINZESS ) 290 MCG CAPS capsule Take 290 mcg by mouth every other day.   meloxicam  (MOBIC ) 15 MG tablet Take 1 tablet (15 mg total) by mouth daily as needed for pain.  Take with food   metFORMIN  (GLUCOPHAGE ) 1000 MG tablet Take 1,000 mg by mouth 2 (two) times daily.   olmesartan -hydrochlorothiazide  (BENICAR  HCT) 40-12.5 MG tablet Take 1 tablet by mouth daily. Pt must keep scheduled followup appt with Cardiology in January 2026 for any more refills. Thank You   omeprazole (PRILOSEC) 40 MG capsule TAKE 1 CAPSULE BY MOUTH 30 MINUTES BEFORE MORNING MEAL ONCE DAILY   ondansetron  (ZOFRAN -ODT) 4 MG disintegrating tablet Take 1 tablet (4 mg total) by mouth every 8 (eight) hours as needed.   polyethylene glycol (MIRALAX / GLYCOLAX)  17 g packet Take 17 g by mouth daily as needed.   traMADol (ULTRAM) 50 MG tablet Take 50 mg by mouth daily as needed (pain).    Turmeric (QC TUMERIC COMPLEX) 500 MG CAPS Take by mouth.   zinc gluconate 50 MG tablet Take 50 mg by mouth daily.   [DISCONTINUED] brompheniramine-pseudoephedrine-DM 30-2-10 MG/5ML syrup Take 10 mLs by mouth every 4 (four) hours as needed.   Cholecalciferol (VITAMIN D3) 25 MCG (1000 UT) CAPS Take 1 capsule (1,000 Units total) by mouth daily. (Patient not taking: Reported on 02/14/2024)   [DISCONTINUED] azithromycin  (ZITHROMAX  Z-PAK) 250 MG tablet Take 2 tablets by mouth on day 1 then take 1 tablet daily for 4 days (Patient not taking: Reported on 02/14/2024)   [DISCONTINUED] nirmatrelvir /ritonavir  (PAXLOVID ) 20 x 150 MG & 10 x 100MG  TBPK Take 3 tablets by mouth 2 (two) times daily.   [DISCONTINUED] predniSONE  (STERAPRED UNI-PAK 21 TAB) 5 MG (21) TBPK tablet Take as directed (Patient not taking: Reported on 02/14/2024)   [DISCONTINUED] tirzepatide  (MOUNJARO ) 10 MG/0.5ML Pen Inject 10 mg into the skin once a week. (Patient not taking: Reported on 02/14/2024)   [DISCONTINUED] tirzepatide  (MOUNJARO ) 5 MG/0.5ML Pen Inject 5 mg into the skin once a week. (Patient not taking: Reported on 02/14/2024)   No facility-administered encounter medications on file as of 02/14/2024.    Review of Systems:  Review of Systems  Constitutional:  Negative for activity change, appetite change and fever.  HENT:  Negative for sore throat.   Eyes: Negative.   Cardiovascular:  Negative for chest pain and leg swelling.  Gastrointestinal:  Negative for abdominal distention, diarrhea and vomiting.  Genitourinary:  Negative for dysuria, frequency and urgency.  Skin:  Negative for color change.  Neurological:  Negative for dizziness and headaches.  Psychiatric/Behavioral:  Negative for behavioral problems and sleep disturbance. The patient is not nervous/anxious.     Health Maintenance  Topic Date  Due   COVID-19 Vaccine (2 - Moderna risk series) 03/01/2024 (Originally 12/26/2022)   OPHTHALMOLOGY EXAM  04/09/2024 (Originally 04/03/2023)   DTaP/Tdap/Td (1 - Tdap) 02/13/2025 (Originally 11/23/1983)   Pneumococcal Vaccine: 50+ Years (1 of 2 - PCV) 02/13/2025 (Originally 11/23/1983)   Hepatitis B Vaccines 19-59 Average Risk (1 of 3 - 19+ 3-dose series) 02/13/2025 (Originally 11/23/1983)   FOOT EXAM  03/28/2024   Diabetic kidney evaluation - Urine ACR  03/29/2024   HEMOGLOBIN A1C  03/29/2024   Diabetic kidney evaluation - eGFR measurement  09/26/2024   Colonoscopy  01/19/2029   Influenza Vaccine  Completed   HPV VACCINES (No Doses Required) Completed   Hepatitis C Screening  Completed   HIV Screening  Completed   Zoster Vaccines- Shingrix  Completed   Meningococcal B Vaccine  Aged Out    Physical Exam: Vitals:   02/14/24 0843  BP: 132/82  Pulse: 85  Temp: 97.7 F (36.5 C)  TempSrc: Temporal  SpO2: 98%  Weight: (!) 315 lb 6.4 oz (143.1 kg)  Height: 6' 3 (1.905 m)   Body mass index is 39.42 kg/m. Physical Exam Constitutional:      Appearance: He is obese.  HENT:     Head: Normocephalic and atraumatic.     Mouth/Throat:     Mouth: Mucous membranes are moist.  Eyes:     Conjunctiva/sclera: Conjunctivae normal.  Cardiovascular:     Rate and Rhythm: Normal rate and regular rhythm.     Pulses: Normal pulses.     Heart sounds: Normal heart sounds.  Pulmonary:     Effort: Pulmonary effort is normal.     Breath sounds: Normal breath sounds.  Abdominal:     General: Bowel sounds are normal.     Palpations: Abdomen is soft.  Musculoskeletal:        General: No swelling. Normal range of motion.     Cervical back: Normal range of motion.  Skin:    General: Skin is warm and dry.  Neurological:     General: No focal deficit present.     Mental Status: He is alert and oriented to person, place, and time.  Psychiatric:        Mood and Affect: Mood normal.        Behavior:  Behavior normal.        Thought Content: Thought content normal.        Judgment: Judgment normal.     Labs reviewed: Basic Metabolic Panel: Recent Labs    03/29/23 1054 09/27/23 0919  NA 141 139  K 4.1 4.1  CL 105 104  CO2 26 26  GLUCOSE 123 146*  BUN 21 16  CREATININE 1.32* 1.46*  CALCIUM 10.2 9.8  TSH 1.25 2.94   Liver Function Tests: Recent Labs    09/27/23 0919  AST 19  ALT 25  BILITOT 1.1  PROT 7.0   No results for input(s): LIPASE, AMYLASE in the last 8760 hours. No results for input(s): AMMONIA in the last 8760 hours. CBC: Recent Labs    03/29/23 1054  WBC 6.4  NEUTROABS 3,757  HGB 14.2  HCT 45.9  MCV 82.3  PLT 247   Lipid Panel: Recent Labs    09/27/23 0919  CHOL 118  HDL 61  LDLCALC 42  TRIG 70  CHOLHDL 1.9   Lab Results  Component Value Date   HGBA1C 8.7 (H) 09/27/2023    Procedures since last visit: No results found.  Assessment/Plan  1. Type 2 diabetes mellitus with diabetic nephropathy, without long-term current use of insulin  (HCC) (Primary) -  Recent dehydration due to inadequate water intake. Last A1c was 8.7%. - Ordered A1c test. - Ordered metabolic panel. - Continue Trulicity  1.5 mg weekly. - Continue metformin  1000 mg twice daily. - Continue Jardiance  25 mg daily. - Hemoglobin A1c  2. Vitamin D  deficiency -  currently not taking vitamin D  supplementation -  Request to check current vitamin D  levels. - Ordered vitamin D  level test. - Vitamin D , 25-hydroxy  3. Bilateral primary osteoarthritis of knee -  Pain well-managed with meloxicam  and recent injection. - Continue meloxicam  15 mg as needed with food. - Continue tramadol 50 mg as needed for pain.  4. Gastroesophageal reflux disease without esophagitis -  - Continue omeprazole 40 mg daily. - Continue famotidine  40 mg at bedtime.  5. Essential hypertension -  Blood pressure 132/82 mmHg. - Continue olmesartan -hydrochlorothiazide  40-12.5 mg daily. -  Continue carvedilol  25 mg twice daily. - Continue  amlodipine  10 mg daily. - Basic Metabolic Panel  6. Acquired hypothyroidism -  Managed with levothyroxine  100 mcg daily. Last TSH normal in August 2025. - Continue levothyroxine  100 mcg daily. - Will repeat TSH in August 2026.  7. Hyperlipidemia, unspecified hyperlipidemia type -  Continue atorvastatin 40 mg daily.  8. Screening for prostate cancer - PSA  9. Morbidly obese (HCC) -  Weight 315 lbs, BMI 39.42. Goal to reduce weight to 300 lbs. Discussed lifestyle modifications. - Encouraged exercise for at least 150 minutes per week. - Advised low-fat, low-salt diet. - Set goal to lose 15 pounds in 6 months.       General Health Maintenance Discussed vaccinations and screenings. Tetanus booster declined. Pneumonia vaccine status unclear. PSA screening requested. - Ordered PSA test. - Discussed tetanus booster next year. - Discussed pneumonia vaccine status.     Labs/tests ordered:  PSA, BMP, A1C, vitamin D    Return in about 6 months (around 08/13/2024), or if symptoms worsen or fail to improve.  Cailynn Bodnar Medina-Vargas, NP      [1] No Known Allergies  "

## 2024-02-15 ENCOUNTER — Ambulatory Visit: Payer: Self-pay | Admitting: *Deleted

## 2024-02-15 ENCOUNTER — Encounter: Payer: Self-pay | Admitting: *Deleted

## 2024-02-15 ENCOUNTER — Encounter: Payer: Self-pay | Admitting: Adult Health

## 2024-02-15 ENCOUNTER — Ambulatory Visit
Admission: EM | Admit: 2024-02-15 | Discharge: 2024-02-15 | Disposition: A | Discharge status: Home / Self Care | Attending: Physician Assistant | Admitting: Physician Assistant

## 2024-02-15 ENCOUNTER — Ambulatory Visit: Admitting: Adult Health

## 2024-02-15 VITALS — BP 128/62 | HR 89 | Temp 98.0°F | Resp 18 | Ht 75.0 in | Wt 315.2 lb

## 2024-02-15 DIAGNOSIS — K5900 Constipation, unspecified: Secondary | ICD-10-CM

## 2024-02-15 DIAGNOSIS — N1831 Chronic kidney disease, stage 3a: Secondary | ICD-10-CM

## 2024-02-15 DIAGNOSIS — K5909 Other constipation: Secondary | ICD-10-CM | POA: Diagnosis not present

## 2024-02-15 DIAGNOSIS — G4700 Insomnia, unspecified: Secondary | ICD-10-CM | POA: Diagnosis not present

## 2024-02-15 DIAGNOSIS — Z7984 Long term (current) use of oral hypoglycemic drugs: Secondary | ICD-10-CM

## 2024-02-15 DIAGNOSIS — R1030 Lower abdominal pain, unspecified: Secondary | ICD-10-CM

## 2024-02-15 DIAGNOSIS — E1121 Type 2 diabetes mellitus with diabetic nephropathy: Secondary | ICD-10-CM

## 2024-02-15 LAB — POCT URINALYSIS DIPSTICK
Blood, UA: NEGATIVE
Glucose, UA: POSITIVE — AB
Leukocytes, UA: NEGATIVE
Nitrite, UA: NEGATIVE
Protein, UA: POSITIVE — AB
Spec Grav, UA: 1.015
Urobilinogen, UA: 0.2 U/dL
pH, UA: 6

## 2024-02-15 LAB — PSA: PSA: 2.22 ng/mL

## 2024-02-15 LAB — BASIC METABOLIC PANEL WITH GFR
BUN/Creatinine Ratio: 11 (calc) (ref 6–22)
BUN: 15 mg/dL (ref 7–25)
CO2: 25 mmol/L (ref 20–32)
Calcium: 9.5 mg/dL (ref 8.6–10.3)
Chloride: 102 mmol/L (ref 98–110)
Creat: 1.42 mg/dL — ABNORMAL HIGH (ref 0.70–1.30)
Glucose, Bld: 174 mg/dL — ABNORMAL HIGH (ref 65–99)
Potassium: 4.2 mmol/L (ref 3.5–5.3)
Sodium: 137 mmol/L (ref 135–146)
eGFR: 57 mL/min/1.73m2 — ABNORMAL LOW

## 2024-02-15 LAB — HEMOGLOBIN A1C
Hgb A1c MFr Bld: 8.5 % — ABNORMAL HIGH
Mean Plasma Glucose: 197 mg/dL
eAG (mmol/L): 10.9 mmol/L

## 2024-02-15 LAB — VITAMIN D 25 HYDROXY (VIT D DEFICIENCY, FRACTURES): Vit D, 25-Hydroxy: 67 ng/mL (ref 30–100)

## 2024-02-15 MED ORDER — HYDROXYZINE HCL 25 MG PO TABS: 25.0000 mg | ORAL_TABLET | Freq: Every day | ORAL | 0 refills | Status: AC

## 2024-02-15 MED ORDER — LINACLOTIDE 290 MCG PO CAPS: 290.0000 ug | ORAL_CAPSULE | Freq: Every day | ORAL | Status: DC

## 2024-02-15 NOTE — ED Triage Notes (Signed)
 Pt reports constipation for 2-3 days. States he was dehydrated earlier this week and received IVF at his PCPs office. LBM was this morning but was small, liquid. States he has also had dry heaves at night. Also reports difficulty sleeping due to this. He takes linzess 

## 2024-02-15 NOTE — Discharge Instructions (Addendum)
 Divide 1 bottle of MiraLAX between  two 32 ounce bottles of Gatorade.  Drink 8 ounces of Gatorade every hour until you have had 2 good bowel movements.  Return if any problems

## 2024-02-15 NOTE — Progress Notes (Signed)
 "  [facility]  Provider:  Jereld Serum DNP  Code Status:  Full Code  Goals of Care:     02/14/2024    8:42 AM  Advanced Directives  Does Patient Have a Medical Advance Directive? No  Would patient like information on creating a medical advance directive? No - Patient declined     Chief Complaint  Patient presents with   Abdominal Pain    Patient was seen in office yesterday- he is concerned that he did not address abdominal pain and nausea he has been experiencing.     Discussed the use of AI scribe software for clinical note transcription with the patient, who gave verbal consent to proceed.   HPI: Patient is a 59 y.o. male seen today for an acute visit for abdominal pain.  He experiences significant nausea and retching without vomiting. He has scheduled an appointment with a gastroenterologist for further evaluation. He is concerned about a possible urinary tract infection due to his medication use, specifically Jardiance . No difficulty urinating, blood in urine, or abnormal discharge. He reports variable urinary stream strength and sometimes feels incomplete bladder emptying.  He describes lower abdominal pain, particularly on the right side, radiating to the middle. The pain was notably severe the previous night and is currently rated as a 4-5 out of 10. He associates the onset of this pain with a recent episode of dehydration earlier in the week.  He manages type 2 diabetes mellitus with Trulicity  1.5 mg once a week, Metformin  1000 mg twice a day, and Jardiance  25 mg daily. His recent A1c is 8.5, down from 8.7, and his average blood sugar at home is around 150-160 mg/dL.  He reports constipation, moving his bowels 3-5 times a week, and takes Linzess  290 mcg every other day. Trulicity  has curbed his appetite, affecting his dietary intake. He attempts to drink 4-5 bottles of water daily, each 500 mL, to stay hydrated.  No fever, vomiting, or abnormal or urinary  discharge. He reports nausea, dry heaves, and variable urinary stream strength, sometimes feeling incomplete bladder emptying.   Discussed lab works done on 02/14/24.  Past Medical History:  Diagnosis Date   Diabetes mellitus without complication (HCC)    Gout    Hypercholesterolemia    Hypertension    Morbid obesity (HCC)    SVT (supraventricular tachycardia)    adenosine  sensitive short RP SVT   Thyroid  disease    Hypothyroidism    Past Surgical History:  Procedure Laterality Date   ACHILLES TENDON REPAIR Right     Allergies[1]  Outpatient Encounter Medications as of 02/15/2024  Medication Sig   amLODipine  (NORVASC ) 10 MG tablet Take 1 tablet (10 mg total) by mouth daily.   atorvastatin (LIPITOR) 40 MG tablet Take 40 mg by mouth daily.   carvedilol  (COREG ) 25 MG tablet Take 1 tablet by mouth twice daily   Dulaglutide  (TRULICITY ) 1.5 MG/0.5ML SOAJ Inject 1.5 mg into the skin once a week.   empagliflozin  (JARDIANCE ) 25 MG TABS tablet Take 1 tablet (25 mg total) by mouth daily before breakfast.   famotidine  (PEPCID ) 40 MG tablet TAKE 1 TABLET BY MOUTH AT BEDTIME   levothyroxine  (SYNTHROID , LEVOTHROID) 100 MCG tablet Take 100 mcg by mouth daily before breakfast.   meloxicam  (MOBIC ) 15 MG tablet Take 1 tablet (15 mg total) by mouth daily as needed for pain. Take with food   metFORMIN  (GLUCOPHAGE ) 1000 MG tablet Take 1,000 mg by mouth 2 (two) times daily.   olmesartan -hydrochlorothiazide  (  BENICAR  HCT) 40-12.5 MG tablet Take 1 tablet by mouth daily. Pt must keep scheduled followup appt with Cardiology in January 2026 for any more refills. Thank You   omeprazole (PRILOSEC) 40 MG capsule TAKE 1 CAPSULE BY MOUTH 30 MINUTES BEFORE MORNING MEAL ONCE DAILY   ondansetron  (ZOFRAN -ODT) 4 MG disintegrating tablet Take 1 tablet (4 mg total) by mouth every 8 (eight) hours as needed.   polyethylene glycol (MIRALAX / GLYCOLAX) 17 g packet Take 17 g by mouth daily as needed.   traMADol (ULTRAM) 50  MG tablet Take 50 mg by mouth daily as needed (pain).    Turmeric (QC TUMERIC COMPLEX) 500 MG CAPS Take by mouth.   zinc gluconate 50 MG tablet Take 50 mg by mouth daily.   [DISCONTINUED] linaclotide  (LINZESS ) 290 MCG CAPS capsule Take 290 mcg by mouth every other day.   Cholecalciferol (VITAMIN D3) 25 MCG (1000 UT) CAPS Take 1 capsule (1,000 Units total) by mouth daily. (Patient not taking: Reported on 02/15/2024)   linaclotide  (LINZESS ) 290 MCG CAPS capsule Take 1 capsule (290 mcg total) by mouth daily.   No facility-administered encounter medications on file as of 02/15/2024.    Review of Systems:  Review of Systems  Constitutional:  Negative for activity change, appetite change and fever.  HENT:  Negative for sore throat.   Eyes: Negative.   Cardiovascular:  Negative for chest pain and leg swelling.  Gastrointestinal:  Positive for abdominal pain, constipation and nausea. Negative for abdominal distention, diarrhea and vomiting.  Genitourinary:  Negative for dysuria, frequency, penile discharge and urgency.       Has urinary frequency  Skin:  Negative for color change.  Neurological:  Negative for dizziness and headaches.  Psychiatric/Behavioral:  Negative for behavioral problems and sleep disturbance. The patient is not nervous/anxious.     Health Maintenance  Topic Date Due   COVID-19 Vaccine (2 - Moderna risk series) 03/01/2024 (Originally 12/26/2022)   OPHTHALMOLOGY EXAM  04/09/2024 (Originally 04/03/2023)   DTaP/Tdap/Td (1 - Tdap) 02/13/2025 (Originally 11/23/1983)   Pneumococcal Vaccine: 50+ Years (1 of 2 - PCV) 02/13/2025 (Originally 11/23/1983)   Hepatitis B Vaccines 19-59 Average Risk (1 of 3 - 19+ 3-dose series) 02/13/2025 (Originally 11/23/1983)   FOOT EXAM  03/28/2024   Diabetic kidney evaluation - Urine ACR  03/29/2024   HEMOGLOBIN A1C  08/13/2024   Diabetic kidney evaluation - eGFR measurement  02/13/2025   Colonoscopy  01/19/2029   Influenza Vaccine  Completed    HPV VACCINES (No Doses Required) Completed   Hepatitis C Screening  Completed   HIV Screening  Completed   Zoster Vaccines- Shingrix  Completed   Meningococcal B Vaccine  Aged Out    Physical Exam: Vitals:   02/15/24 0939  BP: 128/62  Pulse: 89  Resp: 18  Temp: 98 F (36.7 C)  SpO2: 98%  Weight: (!) 315 lb 3.2 oz (143 kg)  Height: 6' 3 (1.905 m)   Body mass index is 39.4 kg/m. Physical Exam Constitutional:      Appearance: Normal appearance. He is obese.  HENT:     Head: Normocephalic and atraumatic.     Mouth/Throat:     Mouth: Mucous membranes are moist.  Eyes:     Conjunctiva/sclera: Conjunctivae normal.  Cardiovascular:     Rate and Rhythm: Normal rate and regular rhythm.     Pulses: Normal pulses.     Heart sounds: Normal heart sounds.  Pulmonary:     Effort: Pulmonary effort is normal.  Breath sounds: Normal breath sounds.  Abdominal:     General: Bowel sounds are normal.     Palpations: Abdomen is soft.  Musculoskeletal:        General: No swelling. Normal range of motion.     Cervical back: Normal range of motion.  Skin:    General: Skin is warm and dry.  Neurological:     General: No focal deficit present.     Mental Status: He is alert and oriented to person, place, and time.  Psychiatric:        Mood and Affect: Mood normal.        Behavior: Behavior normal.        Thought Content: Thought content normal.        Judgment: Judgment normal.     Labs reviewed: Basic Metabolic Panel: Recent Labs    03/29/23 1054 09/27/23 0919 02/14/24 0909  NA 141 139 137  K 4.1 4.1 4.2  CL 105 104 102  CO2 26 26 25   GLUCOSE 123 146* 174*  BUN 21 16 15   CREATININE 1.32* 1.46* 1.42*  CALCIUM 10.2 9.8 9.5  TSH 1.25 2.94  --    Liver Function Tests: Recent Labs    09/27/23 0919  AST 19  ALT 25  BILITOT 1.1  PROT 7.0   No results for input(s): LIPASE, AMYLASE in the last 8760 hours. No results for input(s): AMMONIA in the last 8760  hours. CBC: Recent Labs    03/29/23 1054  WBC 6.4  NEUTROABS 3,757  HGB 14.2  HCT 45.9  MCV 82.3  PLT 247   Lipid Panel: Recent Labs    09/27/23 0919  CHOL 118  HDL 61  LDLCALC 42  TRIG 70  CHOLHDL 1.9   Lab Results  Component Value Date   HGBA1C 8.5 (H) 02/14/2024    Procedures since last visit: No results found.  Assessment/Plan  1. Lower abdominal pain (Primary) -  - Advised increased water intake, 4-5 bottles of 500 mL daily. - POC Urinalysis Dipstick showed trace protein, negative nitrate, negative blood, moderate bilirubin, glucose 2 or more - DG Abd 2 Views -  urine culture  2. Type 2 diabetes mellitus with diabetic nephropathy, without long-term current use of insulin  (HCC) -  A1c decreased from 8.7% to 8.5%. Current medications: Trulicity  1.5 mg weekly, metformin  1000 mg twice daily, Jardiance  25 mg daily. Average blood glucose 150-160 mg/dL.  - Continue Trulicity  1.5 mg weekly, metformin  1000 mg twice daily, Jardiance  25 mg daily. - Monitor blood glucose levels at home.  3. Chronic constipation -  Bowel movements 3-5 times per week. Constipation may be exacerbated by decreased appetite due to Trulicity . - Increased Linzess  to 290 mcg daily. - Encouraged increased dietary fiber intake, particularly vegetables. - linaclotide  (LINZESS ) 290 MCG CAPS capsule; Take 1 capsule (290 mcg total) by mouth daily.  4. Chronic kidney disease (CKD) stage G3a/A3, moderately decreased glomerular filtration rate (GFR) between 45-59 mL/min/1.73 square meter and albuminuria creatinine ratio greater than 300 mg/g (HCC) -  Well-managed kidney function. Recent labs show stable kidney function and normal electrolytes.    Labs/tests ordered:   POC dipstick urinalysis,DG abdomen, urine culture   Return if symptoms worsen or fail to improve.  Ronin Crager Medina-Vargas, NP      [1] No Known Allergies  "

## 2024-02-15 NOTE — Telephone Encounter (Signed)
 FYI Only or Action Required?: FYI only for provider: appointment scheduled on 1/16.  Patient was last seen in primary care on 02/14/2024 by Medina-Vargas, Jereld BROCKS, NP.  Called Nurse Triage reporting Nausea (Nausea, abdominal, frequency).  Symptoms began several days ago.  Interventions attempted: Nothing.  Symptoms are: unchanged.  Triage Disposition: See Physician Within 24 Hours  Patient/caregiver understands and will follow disposition?: Yes    Reason for Triage: UTI and kidney stones/Symptoms: uranating alot/Severe Stomach pain. For 2-3 days   Reason for Disposition  [1] MODERATE pain (e.g., interferes with normal activities) AND [2] pain comes and goes (cramps) AND [3] present > 24 hours  (Exception: Pain with Vomiting or Diarrhea - see that Guideline.)  Answer Assessment - Initial Assessment Questions Patient was seen in office yesterday- he is concerned that he did not address abdominal pain and nausea he has been experiencing.    1. LOCATION: Where does it hurt?      Midline lower pain 2. RADIATION: Does the pain shoot anywhere else? (e.g., chest, back)     no 3. ONSET: When did the pain begin? (Minutes, hours or days ago)      2-3 days  5. PATTERN Does the pain come and go, or is it constant?     Comes and goes- brief 6. SEVERITY: How bad is the pain?  (e.g., Scale 1-10; mild, moderate, or severe)     5/10 7. RECURRENT SYMPTOM: Have you ever had this type of stomach pain before? If Yes, ask: When was the last time? and What happened that time?      no 8. CAUSE: What do you think is causing the stomach pain? (e.g., gallstones, recent abdominal surgery)     UTI vs kidney stone 9. RELIEVING/AGGRAVATING FACTORS: What makes it better or worse? (e.g., antacids, bending or twisting motion, bowel movement)     Emptying bladder/bowel 10. OTHER SYMPTOMS: Do you have any other symptoms? (e.g., back pain, diarrhea, fever, urination pain, vomiting)        Tries to vomit- unable  Protocols used: Abdominal Pain - Male-A-AH

## 2024-02-15 NOTE — ED Provider Notes (Signed)
 " [facility]    CSN: [id number] Arrival date & time: 02/15/24  1413      History   Chief Complaint Chief Complaint  Patient presents with   Constipation    HPI Walter Gonzales is a 59 y.o. male.   Patient complains of constipation and insomnia.  Patient reports he is taking some medications that cause him to be constipated.  Patient states he has been taking Linzess  without relief.  Patient states that he has been having difficulty sleeping for the last 2 nights as well.  Patient denies any fever or chills he has not had any abdominal pain.  Patient reports he has had a decreased appetite.  Patient states he recently saw his primary care doctor and they gave him IV fluids because he was dehydrated from not eating.  Patient reports he has been drinking fluids.  The history is provided by the patient. No language interpreter was used.  Constipation Severity:  Moderate Timing:  Constant Chronicity:  New Stool description:  None produced   Past Medical History:  Diagnosis Date   Diabetes mellitus without complication (HCC)    Gout    Hypercholesterolemia    Hypertension    Morbid obesity (HCC)    SVT (supraventricular tachycardia)    adenosine  sensitive short RP SVT   Thyroid  disease    Hypothyroidism    Patient Active Problem List   Diagnosis Date Noted   OSA (obstructive sleep apnea) 01/25/2023   SVT (supraventricular tachycardia) 10/04/2016   Elevated troponin 10/04/2016   Morbid obesity (HCC) 10/04/2016   Type 2 diabetes mellitus (HCC) 10/04/2016   Loud snoring 10/04/2016   Essential hypertension 10/04/2016   Tachycardia 10/04/2016   CKD (chronic kidney disease), stage III (HCC) 10/04/2016    Past Surgical History:  Procedure Laterality Date   ACHILLES TENDON REPAIR Right        Home Medications    Prior to Admission medications  Medication Sig Start Date End Date Taking? Authorizing Provider  amLODipine  (NORVASC ) 10 MG tablet Take 1 tablet  (10 mg total) by mouth daily. 01/25/23  Yes Turner, Wilbert SAUNDERS, MD  atorvastatin (LIPITOR) 40 MG tablet Take 40 mg by mouth daily.   Yes [provider]  carvedilol  (COREG ) 25 MG tablet Take 1 tablet by mouth twice daily 03/12/23  Yes Turner, Wilbert SAUNDERS, MD  Dulaglutide  (TRULICITY ) 1.5 MG/0.5ML SOAJ Inject 1.5 mg into the skin once a week. 02/08/24  Yes   empagliflozin  (JARDIANCE ) 25 MG TABS tablet Take 1 tablet (25 mg total) by mouth daily before breakfast. 07/27/23  Yes Medina-Vargas, Monina C, NP  famotidine  (PEPCID ) 40 MG tablet TAKE 1 TABLET BY MOUTH AT BEDTIME 01/25/24  Yes Craig Palma R, PA-C  hydrOXYzine  (ATARAX ) 25 MG tablet Take 1 tablet (25 mg total) by mouth at bedtime. 02/15/24  Yes Falcon Mccaskey K, PA-C  levothyroxine  (SYNTHROID , LEVOTHROID) 100 MCG tablet Take 100 mcg by mouth daily before breakfast.   Yes [provider]  linaclotide  (LINZESS ) 290 MCG CAPS capsule Take 1 capsule (290 mcg total) by mouth daily. 02/15/24  Yes Medina-Vargas, Monina C, NP  meloxicam  (MOBIC ) 15 MG tablet Take 1 tablet (15 mg total) by mouth daily as needed for pain. Take with food 09/27/23  Yes Medina-Vargas, Monina C, NP  metFORMIN  (GLUCOPHAGE ) 1000 MG tablet Take 1,000 mg by mouth 2 (two) times daily. 11/20/16  Yes [provider]  olmesartan -hydrochlorothiazide  (BENICAR  HCT) 40-12.5 MG tablet Take 1 tablet by mouth daily. Pt must  keep scheduled followup appt with Cardiology in January 2026 for any more refills. Thank You 01/11/24  Yes Turner, Wilbert SAUNDERS, MD  omeprazole (PRILOSEC) 40 MG capsule TAKE 1 CAPSULE BY MOUTH 30 MINUTES BEFORE MORNING MEAL ONCE DAILY   Yes [provider]  traMADol (ULTRAM) 50 MG tablet Take 50 mg by mouth daily as needed (pain).  11/23/16  Yes [provider]  Turmeric (QC TUMERIC COMPLEX) 500 MG CAPS Take by mouth.   Yes [provider]  zinc gluconate 50 MG tablet Take 50 mg by mouth daily.   Yes [provider]   Cholecalciferol (VITAMIN D3) 25 MCG (1000 UT) CAPS Take 1 capsule (1,000 Units total) by mouth daily. Patient not taking: Reported on 02/15/2024 04/01/23   Medina-Vargas, Monina C, NP  ondansetron  (ZOFRAN -ODT) 4 MG disintegrating tablet Take 1 tablet (4 mg total) by mouth every 8 (eight) hours as needed. Patient not taking: Reported on 02/15/2024 02/12/24     polyethylene glycol (MIRALAX / GLYCOLAX) 17 g packet Take 17 g by mouth daily as needed. Patient not taking: Reported on 02/15/2024    [provider]    Family History Family History  Problem Relation Age of Onset   Hypertension Mother    Diabetes Mother    Heart attack Mother    Pancreatic cancer Mother    Heart attack Father    Heart failure Maternal Grandmother    Diabetes Maternal Grandmother    High blood pressure Maternal Grandmother    High Cholesterol Maternal Grandmother    Esophageal cancer Neg Hx    Liver disease Neg Hx    Colon cancer Neg Hx     Social History Social History[1]   Allergies   Patient has no known allergies.   Review of Systems Review of Systems  Gastrointestinal:  Positive for constipation.  All other systems reviewed and are negative.    Physical Exam Triage Vital Signs ED Triage Vitals  Encounter Vitals Group     BP 02/15/24 1449 (!) 147/95     Girls Systolic BP Percentile --      Girls Diastolic BP Percentile --      Boys Systolic BP Percentile --      Boys Diastolic BP Percentile --      Pulse Rate 02/15/24 1449 82     Resp 02/15/24 1449 18     Temp 02/15/24 1449 98.2 F (36.8 C)     Temp Source 02/15/24 1449 Oral     SpO2 02/15/24 1449 97 %     Weight --      Height --      Head Circumference --      Peak Flow --      Pain Score 02/15/24 1444 0     Pain Loc --      Pain Education --      Exclude from Growth Chart --    No data found.  Updated Vital Signs BP (!) 147/95 (BP Location: Left Arm)   Pulse 82   Temp 98.2 F (36.8 C) (Oral)   Resp 18   SpO2  97%   Visual Acuity Right Eye Distance:   Left Eye Distance:   Bilateral Distance:    Right Eye Near:   Left Eye Near:    Bilateral Near:     Physical Exam Vitals and nursing note reviewed.  Constitutional:      Appearance: He is well-developed.  HENT:     Head: Normocephalic.  Cardiovascular:  Rate and Rhythm: Normal rate.  Pulmonary:     Effort: Pulmonary effort is normal.  Abdominal:     General: Abdomen is flat. There is no distension.     Palpations: Abdomen is soft.     Comments: Abdomen is soft nontender  Musculoskeletal:        General: Normal range of motion.     Cervical back: Normal range of motion.  Skin:    General: Skin is warm.  Neurological:     General: No focal deficit present.     Mental Status: He is alert and oriented to person, place, and time.      UC Treatments / Results  Labs (all labs ordered are listed, but only abnormal results are displayed) Labs Reviewed - No data to display  EKG   Radiology No results found.  Procedures Procedures (including critical care time)  Medications Ordered in UC Medications - No data to display  Initial Impression / Assessment and Plan / UC Course  I have reviewed the triage vital signs and the nursing notes.  Pertinent labs & imaging results that were available during my care of the patient were reviewed by me and considered in my medical decision making (see chart for details).     Patient counseled on constipation he is given Vistaril  for insomnia.  Patient is discharged in stable condition Final Clinical Impressions(s) / UC Diagnoses   Final diagnoses:  Constipation, unspecified constipation type  Insomnia, unspecified type     Discharge Instructions      Divide 1 bottle of MiraLAX between  two 32 ounce bottles of Gatorade.  Drink 8 ounces of Gatorade every hour until you have had 2 good bowel movements.  Return if any problems   ED Prescriptions     Medication Sig Dispense  Auth. Provider   hydrOXYzine  (ATARAX ) 25 MG tablet Take 1 tablet (25 mg total) by mouth at bedtime. 30 tablet Aneka Fagerstrom K, PA-C      PDMP not reviewed this encounter. An After Visit Summary was printed and given to the patient.        [1]  Social History Tobacco Use   Smoking status: Never   Smokeless tobacco: Never  Vaping Use   Vaping status: Never Used  Substance Use Topics   Alcohol use: Yes    Comment: 1-2 year   Drug use: No     [place] Sonny POUR, PA-C 02/15/24 1623  "

## 2024-02-16 ENCOUNTER — Ambulatory Visit: Payer: Self-pay | Admitting: Adult Health

## 2024-02-16 LAB — URINE CULTURE
MICRO NUMBER:: 17480065
Result:: NO GROWTH
SPECIMEN QUALITY:: ADEQUATE

## 2024-02-16 NOTE — Progress Notes (Signed)
urine culture showed no growth

## 2024-02-18 NOTE — Progress Notes (Unsigned)
 "    02/19/2024 Seaborn Nakama [id number] Sep 18, 1965  Referring provider: Phyllis Jereld BROCKS, NP Primary GI doctor: Dr. San  ASSESSMENT AND PLAN:  Constipation 12/2018 colonoscopy [facility] internal hemorrhoids otherwise unremarkable, recall 2030 CT abdomen pelvis 03/2022 ER visit normal stool burden Watery stool on linzess  290 mcg daily, if he does not take it will not have BM,  did okay with miralax but had incomplete BM's Denies hematochezia, rare AB pain better after BM - add on fiber supplement - switch to to amitiza  8 mcg BID, can increase to the 24 mcg BID, stop the linzess , consider trulance if amitiza  is not helpful - Increase fiber/ water intake, decrease caffeine, increase activity level.  GERD, no dysphagia, no melena 12/2018 EGD [facility] showed hiatal hernia and negative H. pylori gastritis On mobic  15mg  daily, on tumeric, on trulicity , occ NSAIDS,no ETOH On omeprazole 40 mg daily  - cut back on NSAIDS - start to take omeprazole 30 mins to an hour before food, try at dinner to avoid thyroid  medication -can take pepcid  as needed - if any dysphagia, melena, worsening GERD will schedule for EGD  Type 2 diabetes On Trulicity  likely contributing to constipation/EGD, discussed with patient, given gastroparesis diet  Morbid obesity  Body mass index is 39.75 kg/m.  -Patient has been advised to make an attempt to improve diet and exercise patterns to aid in weight loss. -Recommended diet heavy in fruits and veggies and low in animal meats, cheeses, and dairy products, appropriate calorie intake  Decreased ejection fraction Echo 2018 EF 45 to 50% No symptoms, follow up with cardiology   Patient Care Team: Medina-Vargas, Jereld BROCKS, NP as PCP - General (Internal Medicine) Shlomo Wilbert SAUNDERS, MD as PCP - Cardiology (Cardiology)  HISTORY OF PRESENT ILLNESS: 58 y.o. male with a past medical history listed below presents for evaluation of constipation and yearly  follow-up for GERD, constipation.   I last saw the patient in the office 05/31/2023 for constipation and reflux.  Was doing well on omeprazole and Linzess  290.  Patient was on Trulicity , given gastroparesis diet.  Discussed the use of AI scribe software for clinical note transcription with the patient, who gave verbal consent to proceed.  History of Present Illness   Mccartney Varma is a 59 year old male with type 2 diabetes, chronic constipation, stage 3a chronic kidney disease, and morbid obesity who presents for evaluation of ongoing bowel irregularities.  Chronic constipation has been managed with daily Linzess , taken 30-60 minutes before eating. With Linzess , he experiences daily loose, watery stools, typically once or twice a day about an hour to an hour and a half after eating. Without Linzess , bowel movements are infrequent. He denies hematochezia and melena. Abdominal pain occurs intermittently and resolves after bowel movements. A recent trial of Miralax in Gatorade produced a solid but incomplete bowel movement.  Episodes of dehydration have occurred, most recently prompting an urgent care visit last week. During these episodes, he feels unwell and experiences dry heaving. Hydration is maintained with 4-5 bottles of water daily, but he remains concerned about fluid loss from frequent loose stools and urination.  Current medications include Jardiance , which he believes increases urination and contributes to dehydration; Trulicity , which may slow gastric motility and cause nausea and dry heaving, especially when dehydrated; and Benicar  (olmesartan  with hydrochlorothiazide ), with awareness that the diuretic component may contribute to dehydration and constipation. Hydroxyzine  (Atarax ) has been prescribed for sleep but has not been taken recently.  He denies heartburn and dysphagia.  He reports occasional mild gastric discomfort. Prefers low sugar Gatorade for hydration and is open to oral  rehydration solutions as needed. He follows with his primary care provider and receives additional input from [employer].      He  reports that he has never smoked. He has never used smokeless tobacco. He reports current alcohol use. He reports that he does not use drugs.  RELEVANT GI HISTORY, IMAGING AND LABS: Results   Labs Renal function: Stable for last ten months; previously worsened, then improved      CBC    Component Value Date/Time   WBC 6.4 03/29/2023 1054   RBC 5.58 03/29/2023 1054   HGB 14.2 03/29/2023 1054   HCT 45.9 03/29/2023 1054   PLT 247 03/29/2023 1054   MCV 82.3 03/29/2023 1054   MCH 25.4 (L) 03/29/2023 1054   MCHC 30.9 (L) 03/29/2023 1054   RDW 14.5 03/29/2023 1054   LYMPHSABS 2,218 07/27/2022 0820   MONOABS 0.4 04/03/2022 1449   EOSABS 166 03/29/2023 1054   BASOSABS 32 03/29/2023 1054   Recent Labs    03/29/23 1054  HGB 14.2    CMP     Component Value Date/Time   NA 137 02/14/2024 0909   K 4.2 02/14/2024 0909   CL 102 02/14/2024 0909   CO2 25 02/14/2024 0909   GLUCOSE 174 (H) 02/14/2024 0909   BUN 15 02/14/2024 0909   CREATININE 1.42 (H) 02/14/2024 0909   CALCIUM 9.5 02/14/2024 0909   PROT 7.0 09/27/2023 0919   ALBUMIN 4.4 04/03/2022 1449   AST 19 09/27/2023 0919   ALT 25 09/27/2023 0919   ALKPHOS 69 04/03/2022 1449   BILITOT 1.1 09/27/2023 0919   GFRNONAA 38 (L) 04/03/2022 1449   GFRAA 52 (L) 12/28/2018 0516      Latest Ref Rng & Units 09/27/2023    9:19 AM 07/27/2022    8:20 AM 04/03/2022    2:49 PM  Hepatic Function  Total Protein 6.1 - 8.1 g/dL 7.0  6.5  7.4   Albumin 3.5 - 5.0 g/dL   4.4   AST 10 - 35 U/L 19  15  27    ALT 9 - 46 U/L 25  23  31    Alk Phosphatase 38 - 126 U/L   69   Total Bilirubin 0.2 - 1.2 mg/dL 1.1  0.5  1.4       Current Medications:   Current Outpatient Medications (Endocrine & Metabolic):    Dulaglutide  (TRULICITY ) 1.5 MG/0.5ML SOAJ, Inject 1.5 mg into the skin once a week.    empagliflozin  (JARDIANCE ) 25 MG TABS tablet, Take 1 tablet (25 mg total) by mouth daily before breakfast.   levothyroxine  (SYNTHROID , LEVOTHROID) 100 MCG tablet, Take 100 mcg by mouth daily before breakfast.   metFORMIN  (GLUCOPHAGE ) 1000 MG tablet, Take 1,000 mg by mouth 2 (two) times daily.  Current Outpatient Medications (Cardiovascular):    amLODipine  (NORVASC ) 10 MG tablet, Take 1 tablet (10 mg total) by mouth daily.   atorvastatin (LIPITOR) 40 MG tablet, Take 40 mg by mouth daily.   carvedilol  (COREG ) 25 MG tablet, Take 1 tablet by mouth twice daily   olmesartan -hydrochlorothiazide  (BENICAR  HCT) 40-12.5 MG tablet, Take 1 tablet by mouth daily. Pt must keep scheduled followup appt with Cardiology in January 2026 for any more refills. Thank You  Current Outpatient Medications (Analgesics):    meloxicam  (MOBIC ) 15 MG tablet, Take 1 tablet (15 mg total) by mouth daily as needed for pain. Take with  food   traMADol (ULTRAM) 50 MG tablet, Take 50 mg by mouth daily as needed (pain).   Current Outpatient Medications (Other):    Cholecalciferol (VITAMIN D3) 25 MCG (1000 UT) CAPS, Take 1 capsule (1,000 Units total) by mouth daily.   famotidine  (PEPCID ) 40 MG tablet, TAKE 1 TABLET BY MOUTH AT BEDTIME   hydrOXYzine  (ATARAX ) 25 MG tablet, Take 1 tablet (25 mg total) by mouth at bedtime.   lubiprostone  (AMITIZA ) 8 MCG capsule, Take 1 capsule (8 mcg total) by mouth 2 (two) times daily with a meal.   omeprazole (PRILOSEC) 40 MG capsule, TAKE 1 CAPSULE BY MOUTH 30 MINUTES BEFORE MORNING MEAL ONCE DAILY   ondansetron  (ZOFRAN -ODT) 4 MG disintegrating tablet, Take 1 tablet (4 mg total) by mouth every 8 (eight) hours as needed.   polyethylene glycol (MIRALAX / GLYCOLAX) 17 g packet, Take 17 g by mouth daily as needed.   Turmeric (QC TUMERIC COMPLEX) 500 MG CAPS, Take by mouth.   zinc gluconate 50 MG tablet, Take 50 mg by mouth daily.  Medical History:  Past Medical History:  Diagnosis Date   Diabetes  mellitus without complication (HCC)    Gout    Hypercholesterolemia    Hypertension    Morbid obesity (HCC)    SVT (supraventricular tachycardia)    adenosine  sensitive short RP SVT   Thyroid  disease    Hypothyroidism   Allergies: No Known Allergies   Surgical History:  He  has a past surgical history that includes Achilles tendon repair (Right). Family History:  His family history includes Diabetes in his maternal grandmother and mother; Heart attack in his father and mother; Heart failure in his maternal grandmother; High Cholesterol in his maternal grandmother; High blood pressure in his maternal grandmother; Hypertension in his mother; Pancreatic cancer in his mother.  REVIEW OF SYSTEMS  : All other systems reviewed and negative except where noted in the History of Present Illness.  PHYSICAL EXAM: BP 126/80   Pulse 89   Ht 6' 3 (1.905 m)   Wt (!) 318 lb (144.2 kg)   BMI 39.75 kg/m  Physical Exam   GENERAL APPEARANCE: Well nourished, in no apparent distress. HEENT: No cervical lymphadenopathy, unremarkable thyroid , sclerae anicteric, conjunctiva pink. RESPIRATORY: Respiratory effort normal, breath sounds equal bilaterally without rales, rhonchi, or wheezing. Lungs clear to auscultation bilaterally. CARDIO: Regular rate and rhythm with no murmurs, rubs, or gallops. Peripheral pulses intact. ABDOMEN: Soft, non-distended, active bowel sounds in all four quadrants. Mild tenderness to palpation, no rebound, no mass appreciated. RECTAL: Declines examination. MUSCULOSKELETAL: Full range of motion, normal gait, without edema. SKIN: Dry, intact without rashes or lesions. No jaundice. NEURO: Alert, oriented, no focal deficits. PSYCH: Cooperative, normal mood and affect.      Alan JONELLE Coombs, PA-C 3:43 PM   "

## 2024-02-19 ENCOUNTER — Encounter: Payer: Self-pay | Admitting: Physician Assistant

## 2024-02-19 ENCOUNTER — Ambulatory Visit: Admitting: Physician Assistant

## 2024-02-19 VITALS — BP 126/80 | HR 89 | Ht 75.0 in | Wt 318.0 lb

## 2024-02-19 DIAGNOSIS — E119 Type 2 diabetes mellitus without complications: Secondary | ICD-10-CM | POA: Diagnosis not present

## 2024-02-19 DIAGNOSIS — N1831 Chronic kidney disease, stage 3a: Secondary | ICD-10-CM

## 2024-02-19 DIAGNOSIS — Z6839 Body mass index (BMI) 39.0-39.9, adult: Secondary | ICD-10-CM

## 2024-02-19 DIAGNOSIS — K219 Gastro-esophageal reflux disease without esophagitis: Secondary | ICD-10-CM

## 2024-02-19 DIAGNOSIS — Z7985 Long-term (current) use of injectable non-insulin antidiabetic drugs: Secondary | ICD-10-CM

## 2024-02-19 DIAGNOSIS — K5900 Constipation, unspecified: Secondary | ICD-10-CM

## 2024-02-19 DIAGNOSIS — E1121 Type 2 diabetes mellitus with diabetic nephropathy: Secondary | ICD-10-CM

## 2024-02-19 MED ORDER — LUBIPROSTONE 8 MCG PO CAPS
8.0000 ug | ORAL_CAPSULE | Freq: Two times a day (BID) | ORAL | 3 refills | Status: AC
  Filled 2024-03-04 (×2): qty 60, 30d supply, fill #0

## 2024-02-19 NOTE — Patient Instructions (Addendum)
 _______________________________________________________  If your blood pressure at your visit was 140/90 or greater, please contact your primary care physician to follow up on this.  _______________________________________________________  If you are age 60 or older, your body mass index should be between 23-30. Your Body mass index is 39.75 kg/m. If this is out of the aforementioned range listed, please consider follow up with your Primary Care Provider.  If you are age 50 or younger, your body mass index should be between 19-25. Your Body mass index is 39.75 kg/m. If this is out of the aformentioned range listed, please consider follow up with your Primary Care Provider.   ________________________________________________________  The [facility] providers would like to encourage you to use MYCHART to communicate with providers for non-urgent requests or questions.  Due to long hold times on the telephone, sending your provider a message by [facility] may be a faster and more efficient way to get a response.  Please allow 48 business hours for a response.  Please remember that this is for non-urgent requests.  _______________________________________________________  [facility] is using a team-based approach to care.  Your team is made up of your doctor and two to three APPS. Our APPS (Nurse Practitioners and Physician Assistants) work with your physician to ensure care continuity for you. They are fully qualified to address your health concerns and develop a treatment plan. They communicate directly with your gastroenterologist to care for you. Seeing the Advanced Practice Practitioners on your physician's team can help you by facilitating care more promptly, often allowing for earlier appointments, access to diagnostic testing, procedures, and other specialty referrals.   You have been scheduled for an appointment with Dr. San on 04-21-24 at 3:20pm. Please arrive 10 minutes early  for your appointment.  Stop the linzess  290 mcg Will try amitiza  8 mcg twice a day with food ( can increase to 24 mcg twice a day) or switch to trulance Add on fiber ( see below)  FIBER SUPPLEMENT You can do metamucil or fibercon once or twice a day but if this causes gas/bloating please switch to Benefiber or Citracel.  Fiber is good for constipation/diarrhea/irritable bowel syndrome.  It can also help with weight loss and can help lower your bad cholesterol (LDL).  Please do 1 TBSP in the morning in water, coffee, or tea.  It can take up to a month before you can see a difference with your bowel movements.  It is cheapest from [employer], [employer], [employer].   Toileting tips to help with your constipation - Drink at least 64-80 ounces of water/liquid per day. - Establish a time to try to move your bowels every day.  For many people, this is after a cup of coffee or after a meal such as breakfast. - Sit all of the way back on the toilet keeping your back fairly straight and while sitting up, try to rest the tops of your forearms on your upper thighs.   - Raising your feet with a step stool/squatty potty can be helpful to improve the angle that allows your stool to pass through the rectum. - Relax the rectum feeling it bulge toward the toilet water.  If you feel your rectum raising toward your body, you are contracting rather than relaxing. - Breathe in and slowly exhale. Belly breath by expanding your belly towards your belly button. Keep belly expanded as you gently direct pressure down and back to the anus.  A low pitched GRRR sound can assist with increasing intra-abdominal  pressure.  (Can also trying to blow on a pinwheel and make it move, this helps with the same belly breathing) - Repeat 3-4 times. If unsuccessful, contract the pelvic floor to restore normal tone and get off the toilet.  Avoid excessive straining. - To reduce excessive wiping by teaching your anus to normally contract, place  hands on outer aspect of knees and resist knee movement outward.  Hold 5-10 second then place hands just inside of knees and resist inward movement of knees.  Hold 5 seconds.  Repeat a few times each way.  Go to the ER if unable to pass gas, severe AB pain, unable to hold down food, any shortness of breath of chest pain.    Understanding Your Weekly GLP-1 Injection  A helpful guide to managing common side effects  You are on a once-weekly injectable medication in the GLP-1 receptor agonist class. These medications can be very effective for blood sugar control, weight loss, and heart protection, fatty liver or OSA, but they can also come with some side effects that are important to understand. The good news is: most side effects can be managed with a few adjustments.  1. Gastroparesis-Like Symptoms These medications slow down your stomach to help you feel full longer -- great for weight loss and blood sugar control, but they can sometimes cause symptoms that feel like gastroparesis (slow stomach emptying). Symptoms may include: -Feeling full quickly when eating -Nausea or vomiting -Bloating or abdominal discomfort -Worsening heartburn or reflux -Acid regurgitation -Stomach spasms or tightness What you can do: ??? Eat small, frequent meals (4-6 per day) ?? Drink fluids between meals, not during ?? Avoid high-fiber foods (like raw veggies or whole grains); cook your veggies well ?? Spread protein throughout the day (try Greek yogurt, eggs, soft meats, Glucerna, milk) ?? Choose soft foods you can mash with a fork ?? Switch to pured foods or liquids during flare-ups ?? Consider reading: Living Well with Gastroparesis by Camelia Bone ?? Downloadable Diet Guide: [facility] Gastroparesis Diet PDF  ?? Tip: Try following a gastroparesis-friendly diet on the day of your injection and the day after, when the medication's effect is strongest.  2. Constipation Since this medication  slows down your digestive system, constipation is very common. Tips to help: ?? Drink plenty of water ???? Stay active with regular exercise ?? Add fiber-rich but gentle foods like kiwi ?? Try a low-dose magnesium supplement at night ?? Use MiraLAX (half to one capful daily) if constipation becomes more frequent, especially if your dose increases  If these strategies dont help, talk to your provider -- they may recommend or prescribe other treatments.  3. When to Call the Doctor or Go to the ER While rare, this medication can slightly increase your risk of serious conditions like: Pancreatitis (inflammation of the pancreas) Gallstones or gallbladder problems Watch for these signs and seek help if you experience: Severe abdominal pain (especially in the upper belly or that radiates to your back) Pain in the right upper side of your abdomen Nausea, vomiting, fever, or chills that dont go away ?? Call your provider or go to the ER if these occur.  4. Who Should NOT Take This Medication? This medication should be avoided if you have: A personal history of pancreatitis  A personal or family history of medullary thyroid  cancer A condition called Multiple Endocrine Neoplasia Syndrome Type 2 (MEN2)  Final Note If the side effects are too bothersome, remember: Most symptoms will go away if  you stop the medication. But many people tolerate it well after the first few weeks, especially with the right strategies in place.

## 2024-02-20 ENCOUNTER — Telehealth: Payer: Self-pay | Admitting: Physician Assistant

## 2024-02-20 NOTE — Telephone Encounter (Signed)
 Inbound call from patient stating that his pharmacy has advised him the in order for him to get his amitiza  capsule, he will need a prior authorization done. Please advise.

## 2024-02-21 ENCOUNTER — Other Ambulatory Visit (HOSPITAL_COMMUNITY): Payer: Self-pay

## 2024-02-21 ENCOUNTER — Telehealth: Payer: Self-pay

## 2024-02-21 NOTE — Telephone Encounter (Signed)
 Pharmacy Patient Advocate Encounter   Received notification from Pt Calls Messages that prior authorization for Amitiza  capsules is required/requested.   Insurance verification completed.   The patient is insured through [facility].   Per test claim: PA required; PA submitted to above mentioned insurance via Latent Key/confirmation #/EOC [id number] Status is pending

## 2024-02-21 NOTE — Telephone Encounter (Signed)
 PA request has been Submitted. New Encounter has been or will be created for follow up. For additional info see Pharmacy Prior Auth telephone encounter from 02-21-2024.

## 2024-02-25 ENCOUNTER — Other Ambulatory Visit (HOSPITAL_COMMUNITY): Payer: Self-pay

## 2024-02-25 NOTE — Telephone Encounter (Signed)
 Pharmacy Patient Advocate Encounter  Received notification from [facility] that Prior Authorization for Amitiza  capsules has been APPROVED from 02-21-2024 to 02-20-2025   PA #/Case ID/Reference #: [id number]

## 2024-03-03 ENCOUNTER — Other Ambulatory Visit (HOSPITAL_COMMUNITY): Payer: Self-pay

## 2024-03-04 ENCOUNTER — Other Ambulatory Visit (HOSPITAL_COMMUNITY): Payer: Self-pay

## 2024-03-05 ENCOUNTER — Other Ambulatory Visit (HOSPITAL_COMMUNITY): Payer: Self-pay

## 2024-03-05 ENCOUNTER — Other Ambulatory Visit: Payer: Self-pay

## 2024-03-05 MED ORDER — LUBIPROSTONE 8 MCG PO CAPS
8.0000 ug | ORAL_CAPSULE | Freq: Two times a day (BID) | ORAL | 3 refills | Status: AC
  Filled 2024-03-05: qty 60, 30d supply, fill #0

## 2024-03-21 ENCOUNTER — Ambulatory Visit: Admitting: Physician Assistant

## 2024-04-21 ENCOUNTER — Ambulatory Visit: Admitting: Gastroenterology

## 2024-08-14 ENCOUNTER — Ambulatory Visit: Admitting: Adult Health
